# Patient Record
Sex: Male | Born: 1975 | Smoking: Former smoker
Health system: Southern US, Community
[De-identification: ages and names within clinical notes are randomized; demographics above are authoritative.]

## PROBLEM LIST (undated history)

## (undated) DIAGNOSIS — I10 Essential (primary) hypertension: Secondary | ICD-10-CM

## (undated) DIAGNOSIS — K56609 Unspecified intestinal obstruction, unspecified as to partial versus complete obstruction: Secondary | ICD-10-CM

## (undated) DIAGNOSIS — G4700 Insomnia, unspecified: Secondary | ICD-10-CM

## (undated) HISTORY — PX: LAPAROTOMY: SHX154

## (undated) HISTORY — DX: Unspecified intestinal obstruction, unspecified as to partial versus complete obstruction: K56.609

---

## 2019-07-04 ENCOUNTER — Other Ambulatory Visit: Payer: Self-pay

## 2019-07-04 ENCOUNTER — Emergency Department (HOSPITAL_COMMUNITY): Payer: Self-pay

## 2019-07-04 ENCOUNTER — Encounter (HOSPITAL_COMMUNITY): Payer: Self-pay | Admitting: Emergency Medicine

## 2019-07-04 ENCOUNTER — Observation Stay (HOSPITAL_COMMUNITY)
Admission: EM | Admit: 2019-07-04 | Discharge: 2019-07-05 | Disposition: A | Discharge status: Home / Self Care | Payer: Self-pay | Attending: Internal Medicine | Admitting: Internal Medicine

## 2019-07-04 ENCOUNTER — Observation Stay (HOSPITAL_COMMUNITY): Payer: Self-pay

## 2019-07-04 DIAGNOSIS — Z0189 Encounter for other specified special examinations: Secondary | ICD-10-CM

## 2019-07-04 DIAGNOSIS — K56609 Unspecified intestinal obstruction, unspecified as to partial versus complete obstruction: Principal | ICD-10-CM | POA: Insufficient documentation

## 2019-07-04 DIAGNOSIS — Z20828 Contact with and (suspected) exposure to other viral communicable diseases: Secondary | ICD-10-CM | POA: Insufficient documentation

## 2019-07-04 DIAGNOSIS — Z6834 Body mass index (BMI) 34.0-34.9, adult: Secondary | ICD-10-CM | POA: Insufficient documentation

## 2019-07-04 DIAGNOSIS — F17200 Nicotine dependence, unspecified, uncomplicated: Secondary | ICD-10-CM | POA: Insufficient documentation

## 2019-07-04 DIAGNOSIS — I10 Essential (primary) hypertension: Secondary | ICD-10-CM | POA: Insufficient documentation

## 2019-07-04 DIAGNOSIS — E669 Obesity, unspecified: Secondary | ICD-10-CM | POA: Insufficient documentation

## 2019-07-04 HISTORY — DX: Essential (primary) hypertension: I10

## 2019-07-04 HISTORY — DX: Insomnia, unspecified: G47.00

## 2019-07-04 LAB — RAPID URINE DRUG SCREEN, HOSP PERFORMED
Amphetamines: NOT DETECTED
Barbiturates: NOT DETECTED
Benzodiazepines: NOT DETECTED
Cocaine: NOT DETECTED
Opiates: NOT DETECTED
Tetrahydrocannabinol: POSITIVE — AB

## 2019-07-04 LAB — COMPREHENSIVE METABOLIC PANEL
ALT: 28 U/L (ref 0–44)
AST: 20 U/L (ref 15–41)
Albumin: 4.3 g/dL (ref 3.5–5.0)
Alkaline Phosphatase: 87 U/L (ref 38–126)
Anion gap: 12 (ref 5–15)
BUN: 15 mg/dL (ref 6–20)
CO2: 20 mmol/L — ABNORMAL LOW (ref 22–32)
Calcium: 9.6 mg/dL (ref 8.9–10.3)
Chloride: 105 mmol/L (ref 98–111)
Creatinine, Ser: 0.9 mg/dL (ref 0.61–1.24)
GFR calc Af Amer: >60 mL/min (ref >60)
GFR calc non Af Amer: >60 mL/min (ref >60)
Glucose, Bld: 118 mg/dL — ABNORMAL HIGH (ref 70–99)
Potassium: 4.1 mmol/L (ref 3.5–5.1)
Sodium: 137 mmol/L (ref 135–145)
Total Bilirubin: 0.8 mg/dL (ref 0.3–1.2)
Total Protein: 7.7 g/dL (ref 6.5–8.1)

## 2019-07-04 LAB — CBC
HCT: 49.2 % (ref 39.0–52.0)
Hemoglobin: 16.7 g/dL (ref 13.0–17.0)
MCH: 30.4 pg (ref 26.0–34.0)
MCHC: 33.9 g/dL (ref 30.0–36.0)
MCV: 89.6 fL (ref 80.0–100.0)
Platelets: 173 10*3/uL (ref 150–400)
RBC: 5.49 MIL/uL (ref 4.22–5.81)
RDW: 13.1 % (ref 11.5–15.5)
WBC: 10.8 10*3/uL — ABNORMAL HIGH (ref 4.0–10.5)
nRBC: 0 % (ref 0.0–0.2)

## 2019-07-04 LAB — URINALYSIS, ROUTINE W REFLEX MICROSCOPIC
Bacteria, UA: NONE SEEN
Bilirubin Urine: NEGATIVE
Glucose, UA: NEGATIVE mg/dL
Ketones, ur: NEGATIVE mg/dL
Leukocytes,Ua: NEGATIVE
Nitrite: NEGATIVE
Protein, ur: NEGATIVE mg/dL
Specific Gravity, Urine: 1.014 (ref 1.005–1.030)
pH: 5 (ref 5.0–8.0)

## 2019-07-04 LAB — SARS CORONAVIRUS 2 (TAT 6-24 HRS): SARS Coronavirus 2: NEGATIVE

## 2019-07-04 LAB — LIPASE, BLOOD: Lipase: 21 U/L (ref 11–51)

## 2019-07-04 MED ORDER — ONDANSETRON HCL 4 MG/2ML IJ SOLN
4.0000 mg | Freq: Once | INTRAMUSCULAR | Status: AC
  Administered 2019-07-04: 4 mg via INTRAVENOUS
  Filled 2019-07-04: qty 2

## 2019-07-04 MED ORDER — ACETAMINOPHEN 325 MG PO TABS: 650.0000 mg | ORAL_TABLET | Freq: Four times a day (QID) | ORAL | Status: DC | PRN

## 2019-07-04 MED ORDER — LORAZEPAM 2 MG/ML IJ SOLN
1.0000 mg | Freq: Once | INTRAMUSCULAR | Status: AC
  Administered 2019-07-05: 1 mg via INTRAVENOUS
  Filled 2019-07-04: qty 1

## 2019-07-04 MED ORDER — BENZOCAINE 20 % MT AERO
INHALATION_SPRAY | Freq: Once | OROMUCOSAL | Status: AC
  Administered 2019-07-04: 2 via OROMUCOSAL

## 2019-07-04 MED ORDER — PROMETHAZINE HCL 25 MG/ML IJ SOLN
12.5000 mg | Freq: Four times a day (QID) | INTRAMUSCULAR | Status: DC | PRN
  Administered 2019-07-04 – 2019-07-05 (×2): 12.5 mg via INTRAVENOUS
  Filled 2019-07-04 (×2): qty 1

## 2019-07-04 MED ORDER — LORAZEPAM 2 MG/ML IJ SOLN
1.0000 mg | Freq: Once | INTRAMUSCULAR | Status: AC
  Administered 2019-07-04: 1 mg via INTRAVENOUS
  Filled 2019-07-04: qty 1

## 2019-07-04 MED ORDER — MORPHINE SULFATE (PF) 4 MG/ML IV SOLN
4.0000 mg | Freq: Once | INTRAVENOUS | Status: AC
  Administered 2019-07-04: 4 mg via INTRAVENOUS
  Filled 2019-07-04: qty 1

## 2019-07-04 MED ORDER — HYDROMORPHONE HCL 1 MG/ML IJ SOLN
0.5000 mg | INTRAMUSCULAR | Status: DC | PRN
  Administered 2019-07-05 (×2): 1 mg via INTRAVENOUS
  Filled 2019-07-04 (×2): qty 1

## 2019-07-04 MED ORDER — SODIUM CHLORIDE 0.9% FLUSH
3.0000 mL | Freq: Once | INTRAVENOUS | Status: AC
  Administered 2019-07-04: 3 mL via INTRAVENOUS

## 2019-07-04 MED ORDER — IOHEXOL 300 MG/ML  SOLN
100.0000 mL | Freq: Once | INTRAMUSCULAR | Status: AC | PRN
  Administered 2019-07-04: 04:00:00 100 mL via INTRAVENOUS

## 2019-07-04 MED ORDER — ONDANSETRON HCL 4 MG/2ML IJ SOLN
4.0000 mg | Freq: Four times a day (QID) | INTRAMUSCULAR | Status: DC | PRN
  Administered 2019-07-04 (×2): 4 mg via INTRAVENOUS
  Filled 2019-07-04 (×2): qty 2

## 2019-07-04 MED ORDER — LORAZEPAM 2 MG/ML IJ SOLN
0.5000 mg | Freq: Once | INTRAMUSCULAR | Status: AC
  Administered 2019-07-04: 0.5 mg via INTRAVENOUS
  Filled 2019-07-04: qty 1

## 2019-07-04 MED ORDER — HYDROMORPHONE HCL 1 MG/ML IJ SOLN
0.5000 mg | INTRAMUSCULAR | Status: DC | PRN
  Administered 2019-07-04 (×4): 0.5 mg via INTRAVENOUS
  Filled 2019-07-04 (×2): qty 0.5
  Filled 2019-07-04 (×2): qty 1

## 2019-07-04 MED ORDER — ONDANSETRON 4 MG PO TBDP: 4.0000 mg | ORAL_TABLET | Freq: Once | ORAL | Status: DC | PRN

## 2019-07-04 MED ORDER — ACETAMINOPHEN 650 MG RE SUPP: 650.0000 mg | Freq: Four times a day (QID) | RECTAL | Status: DC | PRN

## 2019-07-04 MED ORDER — ENOXAPARIN SODIUM 40 MG/0.4ML ~~LOC~~ SOLN
40.0000 mg | SUBCUTANEOUS | Status: DC
  Administered 2019-07-04: 21:00:00 40 mg via SUBCUTANEOUS
  Filled 2019-07-04: qty 0.4

## 2019-07-04 MED ORDER — INFLUENZA VAC SPLIT QUAD 0.5 ML IM SUSY: 0.5000 mL | PREFILLED_SYRINGE | INTRAMUSCULAR | Status: DC

## 2019-07-04 MED ORDER — DIATRIZOATE MEGLUMINE & SODIUM 66-10 % PO SOLN
90.0000 mL | Freq: Once | ORAL | Status: AC
  Administered 2019-07-04: 90 mL via NASOGASTRIC
  Filled 2019-07-04: qty 90

## 2019-07-04 MED ORDER — SODIUM CHLORIDE 0.9 % IV SOLN: INTRAVENOUS | Status: DC
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
## 2019-07-04 NOTE — ED Triage Notes (Signed)
Pt arriving via EMS for right side abdominal pain x3 days. Nausea but no vomiting. No fever. Covid negative approx 1 month ago.

## 2019-07-05 ENCOUNTER — Observation Stay (HOSPITAL_COMMUNITY): Payer: Self-pay

## 2019-07-05 LAB — COMPREHENSIVE METABOLIC PANEL
ALT: 34 U/L (ref 0–44)
AST: 23 U/L (ref 15–41)
Albumin: 4.9 g/dL (ref 3.5–5.0)
Alkaline Phosphatase: 94 U/L (ref 38–126)
Anion gap: 15 (ref 5–15)
BUN: 18 mg/dL (ref 6–20)
CO2: 34 mmol/L — ABNORMAL HIGH (ref 22–32)
Calcium: 10.4 mg/dL — ABNORMAL HIGH (ref 8.9–10.3)
Chloride: 96 mmol/L — ABNORMAL LOW (ref 98–111)
Creatinine, Ser: 1.24 mg/dL (ref 0.61–1.24)
GFR calc Af Amer: >60 mL/min (ref >60)
GFR calc non Af Amer: >60 mL/min (ref >60)
Glucose, Bld: 114 mg/dL — ABNORMAL HIGH (ref 70–99)
Potassium: 3.2 mmol/L — ABNORMAL LOW (ref 3.5–5.1)
Sodium: 145 mmol/L (ref 135–145)
Total Bilirubin: 1.2 mg/dL (ref 0.3–1.2)
Total Protein: 8.7 g/dL — ABNORMAL HIGH (ref 6.5–8.1)

## 2019-07-05 LAB — HIV ANTIBODY (ROUTINE TESTING W REFLEX): HIV Screen 4th Generation wRfx: NONREACTIVE

## 2019-07-05 LAB — MAGNESIUM: Magnesium: 2.5 mg/dL — ABNORMAL HIGH (ref 1.7–2.4)

## 2019-07-05 LAB — PHOSPHORUS: Phosphorus: 3.8 mg/dL (ref 2.5–4.6)

## 2019-07-05 MED ORDER — POTASSIUM CHLORIDE CRYS ER 20 MEQ PO TBCR
40.0000 meq | EXTENDED_RELEASE_TABLET | Freq: Once | ORAL | Status: AC
  Administered 2019-07-05: 13:00:00 40 meq via ORAL
  Filled 2019-07-05: qty 2
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
## 2019-07-05 NOTE — Discharge Instructions (Signed)
Follow with PCP as needed  Please get a complete blood count and chemistry panel checked by your Primary MD at your next visit, and again as instructed by your Primary MD. Please get your medications reviewed and adjusted by your Primary MD.  Please request your Primary MD to go over all Hospital Tests and Procedure/Radiological results at the follow up, please get all Hospital records sent to your Prim MD by signing hospital release before you go home.  In some cases, there will be blood work, cultures and biopsy results pending at the time of your discharge. Please request that your primary care M.D. goes through all the records of your hospital data and follows up on these results.  If you had Pneumonia of Lung problems at the Hospital: Please get a 2 view Chest X ray done in 6-8 weeks after hospital discharge or sooner if instructed by your Primary MD.  If you have Congestive Heart Failure: Please call your Cardiologist or Primary MD anytime you have any of the following symptoms:  1) 3 pound weight gain in 24 hours or 5 pounds in 1 week  2) shortness of breath, with or without a dry hacking cough  3) swelling in the hands, feet or stomach  4) if you have to sleep on extra pillows at night in order to breathe  Follow cardiac low salt diet and 1.5 lit/day fluid restriction.  If you have diabetes Accuchecks 4 times/day, Once in AM empty stomach and then before each meal. Log in all results and show them to your primary doctor at your next visit. If any glucose reading is under 80 or above 300 call your primary MD immediately.  If you have Seizure/Convulsions/Epilepsy: Please do not drive, operate heavy machinery, participate in activities at heights or participate in high speed sports until you have seen by Primary MD or a Neurologist and advised to do so again. Per [facility] statutes, patients with seizures are not allowed to drive until they have been seizure-free for six  months.  Use caution when using heavy equipment or power tools. Avoid working on ladders or at heights. Take showers instead of baths. Ensure the water temperature is not too high on the home water heater. Do not go swimming alone. Do not lock yourself in a room alone (i.e. bathroom). When caring for infants or small children, sit down when holding, feeding, or changing them to minimize risk of injury to the child in the event you have a seizure. Maintain good sleep hygiene. Avoid alcohol.   If you had Gastrointestinal Bleeding: Please ask your Primary MD to check a complete blood count within one week of discharge or at your next visit. Your endoscopic/colonoscopic biopsies that are pending at the time of discharge, will also need to followed by your Primary MD.  Get Medicines reviewed and adjusted. Please take all your medications with you for your next visit with your Primary MD  Please request your Primary MD to go over all hospital tests and procedure/radiological results at the follow up, please ask your Primary MD to get all Hospital records sent to his/her office.  If you experience worsening of your admission symptoms, develop shortness of breath, life threatening emergency, suicidal or homicidal thoughts you must seek medical attention immediately by calling 911 or calling your MD immediately  if symptoms less severe.  You must read complete instructions/literature along with all the possible adverse reactions/side effects for all the Medicines you take and that have  been prescribed to you. Take any new Medicines after you have completely understood and accpet all the possible adverse reactions/side effects.   Do not drive or operate heavy machinery when taking Pain medications.   Do not take more than prescribed Pain, Sleep and Anxiety Medications  Special Instructions: If you have smoked or chewed Tobacco  in the last 2 yrs please stop smoking, stop any regular Alcohol  and or any  Recreational drug use.  Wear Seat belts while driving.  Please note You were cared for by a hospitalist during your hospital stay. If you have any questions about your discharge medications or the care you received while you were in the hospital after you are discharged, you can call the unit and asked to speak with the hospitalist on call if the hospitalist that took care of you is not available. Once you are discharged, your primary care physician will handle any further medical issues. Please note that NO REFILLS for any discharge medications will be authorized once you are discharged, as it is imperative that you return to your primary care physician (or establish a relationship with a primary care physician if you do not have one) for your aftercare needs so that they can reassess your need for medications and monitor your lab values.  You can reach the hospitalist office at phone [phone] or fax [phone]   If you do not have a primary care physician, you can call [phone] for a physician referral.  Activity: As tolerated with Full fall precautions use walker/cane & assistance as needed    Diet: regular  Disposition Home

## 2019-07-05 NOTE — Progress Notes (Signed)
Subjective/Chief Complaint: No complaints Denies abdominal pain Passing flatus   Objective: Vital signs in last 24 hours: Temp:  [97.5 F (36.4 C)-97.6 F (36.4 C)] 97.6 F (36.4 C) (12/26 2135) Pulse Rate:  [63-98] 82 (12/27 0538) Resp:  [16-28] 19 (12/27 0538) BP: (109-135)/(59-100) 122/85 (12/27 0538) SpO2:  [95 %-99 %] 95 % (12/27 0538) Weight:  [105.2 kg] 105.2 kg (12/27 0500)    Intake/Output from previous day: 12/26 0701 - 12/27 0700 In: 2809.3 [P.O.:1020; I.V.:1789.3] Out: 17300 [Emesis/NG output:16300; Blood:1000] Intake/Output this shift: Total I/O In: -  Out: 1100 [Emesis/NG output:1100]  Exam: Awake and alert Abdomen soft, NT/ND   Lab Results:  Recent Labs    07/04/19 0012  WBC 10.8*  HGB 16.7  HCT 49.2  PLT 173   BMET Recent Labs    07/04/19 0012 07/05/19 0606  NA 137 145  K 4.1 3.2*  CL 105 96*  CO2 20* 34*  GLUCOSE 118* 114*  BUN 15 18  CREATININE 0.90 1.24  CALCIUM 9.6 10.4*   PT/INR No results for input(s): LABPROT, INR in the last 72 hours. ABG No results for input(s): PHART, HCO3 in the last 72 hours.  Invalid input(s): PCO2, PO2  Studies/Results: DG Abdomen 1 View  Result Date: 07/04/2019 CLINICAL DATA:  NG tube placement. EXAM: ABDOMEN - 1 VIEW COMPARISON:  None. FINDINGS: Nasogastric tube is looped once over the stomach with tip in the right upper quadrant likely over the distal stomach/proximal duodenum. Bowel gas pattern is nonobstructive. No free peritoneal air. Mild degenerate change of the spine. IMPRESSION: 1.  Nonobstructive bowel gas pattern. 2. Nasogastric tube looped once over the stomach with tip in the right upper quadrant likely over the distal stomach or proximal duodenum. Electronically Signed   By: Marin Olp M.D.   On: 07/04/2019 05:44   CT ABDOMEN PELVIS W CONTRAST  Result Date: 07/04/2019 CLINICAL DATA:  Abdominal pain right-sided for 3 days. Nausea but no vomiting. EXAM: CT ABDOMEN AND PELVIS  WITH CONTRAST TECHNIQUE: Multidetector CT imaging of the abdomen and pelvis was performed using the standard protocol following bolus administration of intravenous contrast. CONTRAST:  169mL OMNIPAQUE IOHEXOL 300 MG/ML  SOLN COMPARISON:  None. FINDINGS: Lower chest: Minimal dependent bibasilar atelectasis. Hepatobiliary: Minimal diffuse low-attenuation of the liver without focal mass. Gallbladder and biliary tree are normal. Pancreas: Normal. Spleen: Normal. Adrenals/Urinary Tract: Adrenal glands are normal. Kidneys are normal size without hydronephrosis or nephrolithiasis. Ureters and bladder are normal. Stomach/Bowel: Stomach is normal. There are a few fluid-filled minimally dilated proximal jejunal loops with transition to normal caliber small bowel in the right mid abdomen. Remainder of the small bowel is normal. Appendix is normal. Colon is normal. Vascular/Lymphatic: Normal. Reproductive: Normal. Other: No free fluid or focal inflammatory change. Musculoskeletal: Normal. IMPRESSION: 1.  No acute process in the right lower quadrant. 2. A few fluid-filled minimally dilated proximal jejunal loops with transition to normal caliber small bowel in the jejunum over the left mid abdomen. Findings may be due to focal ileus, although early/partial small bowel obstruction is possible. Electronically Signed   By: Marin Olp M.D.   On: 07/04/2019 04:33   DG Abd Portable 1V-Small Bowel Obstruction Protocol-initial, 8 hr delay  Result Date: 07/05/2019 CLINICAL DATA:  Small-bowel obstruction EXAM: PORTABLE ABDOMEN - 1 VIEW COMPARISON:  07/04/2019 FINDINGS: There is contrast material within the colon, extending into the sigmoid colon. No dilated small bowel identified. Enteric tube tip and side port project near the gastroduodenal junction.  IMPRESSION: Contrast material extending to the sigmoid colon Electronically Signed   By: Deatra Robinson M.D.   On: 07/05/2019 02:26   DG Abd Portable 1V-Small Bowel  Protocol-Position Verification  Result Date: 07/04/2019 CLINICAL DATA:  NG tube placement. Right-sided abdominal pain. Nausea. EXAM: PORTABLE ABDOMEN - 1 VIEW COMPARISON:  None. FINDINGS: NG tube tip is in the distal stomach. The bowel gas pattern is normal. Contrast in the bladder from the recent CT scan. Bones are normal. IMPRESSION: Benign-appearing abdomen. NG tube tip in good position in the distal stomach. Electronically Signed   By: Francene Boyers M.D.   On: 07/04/2019 09:56    Anti-infectives: Anti-infectives (From admission, onward)   None      Assessment/Plan: pSBO  Small bowel protocol xray shows contrast has made it all the way to the sigmoid colon.   Will d/c NG and start liquids From a surgical standpoint, he may be discharged after lunch if tolerating po.  He only needs follow up with [facility] PRN.   LOS: 0 days    Abigail Miyamoto 07/05/2019

## 2019-07-05 NOTE — Progress Notes (Signed)
Nsg Discharge Note  Admit Date:  07/04/2019 Discharge date: 07/05/2019   Kemo Spruce to be D/C'd Home per MD order.  AVS completed.  Copy for chart, and copy for patient signed, and dated. Patient/caregiver able to verbalize understanding.  Discharge Medication: Allergies as of 07/05/2019   No Known Allergies     Medication List    TAKE these medications   zolpidem 10 MG tablet Commonly known as: AMBIEN Take 10 mg by mouth at bedtime as needed for sleep.       Discharge Assessment: Vitals:   07/04/19 2135 07/05/19 0538  BP: (!) 109/59 122/85  Pulse: 63 82  Resp: 18 19  Temp: 97.6 F (36.4 C)   SpO2: 99% 95%   Skin clean, dry and intact without evidence of skin break down, no evidence of skin tears noted. IV catheter discontinued intact. Site without signs and symptoms of complications - no redness or edema noted at insertion site, patient denies c/o pain - only slight tenderness at site.  Dressing with slight pressure applied.  D/c Instructions-Education: Discharge instructions given to patient/family with verbalized understanding. D/c education completed with patient/family including follow up instructions, medication list, d/c activities limitations if indicated, with other d/c instructions as indicated by MD - patient able to verbalize understanding, all questions fully answered. Patient instructed to return to ED, call 911, or call MD for any changes in condition.  Patient ambulated independently and was D/C'd home via private auto.  Eustace Pen, RN 07/05/2019 1:48 PM

## 2019-07-06 LAB — CBC WITH DIFFERENTIAL/PLATELET
Abs Immature Granulocytes: 0.03 10*3/uL (ref 0.00–0.07)
Basophils Absolute: 0.1 10*3/uL (ref 0.0–0.1)
Basophils Relative: 1 %
Eosinophils Absolute: 0 10*3/uL (ref 0.0–0.5)
Eosinophils Relative: 0 %
HCT: 55.6 % — ABNORMAL HIGH (ref 39.0–52.0)
Hemoglobin: 18.7 g/dL — ABNORMAL HIGH (ref 13.0–17.0)
Immature Granulocytes: 0 %
Lymphocytes Relative: 12 %
Lymphs Abs: 1.3 10*3/uL (ref 0.7–4.0)
MCH: 30.5 pg (ref 26.0–34.0)
MCHC: 33.6 g/dL (ref 30.0–36.0)
MCV: 90.6 fL (ref 80.0–100.0)
Monocytes Absolute: 1.2 10*3/uL — ABNORMAL HIGH (ref 0.1–1.0)
Monocytes Relative: 11 %
Neutro Abs: 8.4 10*3/uL — ABNORMAL HIGH (ref 1.7–7.7)
Neutrophils Relative %: 76 %
Platelets: 188 10*3/uL (ref 150–400)
RBC: 6.14 MIL/uL — ABNORMAL HIGH (ref 4.22–5.81)
RDW: 13.2 % (ref 11.5–15.5)
WBC: 11 10*3/uL — ABNORMAL HIGH (ref 4.0–10.5)
nRBC: 0 % (ref 0.0–0.2)

## 2019-07-07 ENCOUNTER — Emergency Department (HOSPITAL_COMMUNITY): Payer: Self-pay

## 2019-07-07 ENCOUNTER — Other Ambulatory Visit: Payer: Self-pay

## 2019-07-07 ENCOUNTER — Inpatient Hospital Stay (HOSPITAL_COMMUNITY)
Admission: EM | Admit: 2019-07-07 | Discharge: 2019-07-11 | DRG: 336 | Disposition: A | Discharge status: Home / Self Care | Payer: Self-pay | Attending: Surgery | Admitting: Surgery

## 2019-07-07 ENCOUNTER — Encounter (HOSPITAL_COMMUNITY): Payer: Self-pay

## 2019-07-07 DIAGNOSIS — Q438 Other specified congenital malformations of intestine: Secondary | ICD-10-CM

## 2019-07-07 DIAGNOSIS — F17200 Nicotine dependence, unspecified, uncomplicated: Secondary | ICD-10-CM | POA: Diagnosis present

## 2019-07-07 DIAGNOSIS — K56600 Partial intestinal obstruction, unspecified as to cause: Secondary | ICD-10-CM | POA: Diagnosis present

## 2019-07-07 DIAGNOSIS — E860 Dehydration: Secondary | ICD-10-CM | POA: Diagnosis present

## 2019-07-07 DIAGNOSIS — Z20822 Contact with and (suspected) exposure to covid-19: Secondary | ICD-10-CM | POA: Diagnosis present

## 2019-07-07 DIAGNOSIS — I10 Essential (primary) hypertension: Secondary | ICD-10-CM | POA: Diagnosis present

## 2019-07-07 DIAGNOSIS — K56609 Unspecified intestinal obstruction, unspecified as to partial versus complete obstruction: Secondary | ICD-10-CM

## 2019-07-07 DIAGNOSIS — Z79899 Other long term (current) drug therapy: Secondary | ICD-10-CM

## 2019-07-07 DIAGNOSIS — G4700 Insomnia, unspecified: Secondary | ICD-10-CM | POA: Diagnosis present

## 2019-07-07 DIAGNOSIS — K5651 Intestinal adhesions [bands], with partial obstruction: Principal | ICD-10-CM | POA: Diagnosis present

## 2019-07-07 LAB — CBC WITH DIFFERENTIAL/PLATELET
Abs Immature Granulocytes: 0.04 10*3/uL (ref 0.00–0.07)
Basophils Absolute: 0.1 10*3/uL (ref 0.0–0.1)
Basophils Relative: 1 %
Eosinophils Absolute: 0.3 10*3/uL (ref 0.0–0.5)
Eosinophils Relative: 3 %
HCT: 51.2 % (ref 39.0–52.0)
Hemoglobin: 17.7 g/dL — ABNORMAL HIGH (ref 13.0–17.0)
Immature Granulocytes: 0 %
Lymphocytes Relative: 16 %
Lymphs Abs: 1.6 10*3/uL (ref 0.7–4.0)
MCH: 30.3 pg (ref 26.0–34.0)
MCHC: 34.6 g/dL (ref 30.0–36.0)
MCV: 87.5 fL (ref 80.0–100.0)
Monocytes Absolute: 1 10*3/uL (ref 0.1–1.0)
Monocytes Relative: 9 %
Neutro Abs: 7.4 10*3/uL (ref 1.7–7.7)
Neutrophils Relative %: 71 %
Platelets: 207 10*3/uL (ref 150–400)
RBC: 5.85 MIL/uL — ABNORMAL HIGH (ref 4.22–5.81)
RDW: 12.7 % (ref 11.5–15.5)
WBC: 10.3 10*3/uL (ref 4.0–10.5)
nRBC: 0 % (ref 0.0–0.2)

## 2019-07-07 LAB — COMPREHENSIVE METABOLIC PANEL
ALT: 31 U/L (ref 0–44)
AST: 30 U/L (ref 15–41)
Albumin: 4.4 g/dL (ref 3.5–5.0)
Alkaline Phosphatase: 79 U/L (ref 38–126)
Anion gap: 12 (ref 5–15)
BUN: 22 mg/dL — ABNORMAL HIGH (ref 6–20)
CO2: 24 mmol/L (ref 22–32)
Calcium: 9.8 mg/dL (ref 8.9–10.3)
Chloride: 100 mmol/L (ref 98–111)
Creatinine, Ser: 1 mg/dL (ref 0.61–1.24)
GFR calc Af Amer: >60 mL/min (ref >60)
GFR calc non Af Amer: >60 mL/min (ref >60)
Glucose, Bld: 125 mg/dL — ABNORMAL HIGH (ref 70–99)
Potassium: 3.7 mmol/L (ref 3.5–5.1)
Sodium: 136 mmol/L (ref 135–145)
Total Bilirubin: 1.4 mg/dL — ABNORMAL HIGH (ref 0.3–1.2)
Total Protein: 7.8 g/dL (ref 6.5–8.1)

## 2019-07-07 LAB — URINALYSIS, ROUTINE W REFLEX MICROSCOPIC
Bilirubin Urine: NEGATIVE
Glucose, UA: NEGATIVE mg/dL
Hgb urine dipstick: NEGATIVE
Ketones, ur: NEGATIVE mg/dL
Leukocytes,Ua: NEGATIVE
Nitrite: NEGATIVE
Protein, ur: NEGATIVE mg/dL
Specific Gravity, Urine: 1.035 — ABNORMAL HIGH (ref 1.005–1.030)
pH: 7 (ref 5.0–8.0)

## 2019-07-07 LAB — LIPASE, BLOOD: Lipase: 20 U/L (ref 11–51)

## 2019-07-07 MED ORDER — PANTOPRAZOLE SODIUM 40 MG IV SOLR
40.0000 mg | Freq: Every day | INTRAVENOUS | Status: DC
  Administered 2019-07-07 – 2019-07-10 (×4): 40 mg via INTRAVENOUS
  Filled 2019-07-07 (×4): qty 40

## 2019-07-07 MED ORDER — ONDANSETRON 4 MG PO TBDP: 4.0000 mg | ORAL_TABLET | Freq: Four times a day (QID) | ORAL | Status: DC | PRN

## 2019-07-07 MED ORDER — METOPROLOL TARTRATE 5 MG/5ML IV SOLN: 5.0000 mg | Freq: Four times a day (QID) | INTRAVENOUS | Status: DC | PRN

## 2019-07-07 MED ORDER — LORAZEPAM 2 MG/ML IJ SOLN
1.0000 mg | Freq: Once | INTRAMUSCULAR | Status: AC
  Administered 2019-07-08: 1 mg via INTRAVENOUS
  Filled 2019-07-07: qty 1

## 2019-07-07 MED ORDER — KCL IN DEXTROSE-NACL 20-5-0.9 MEQ/L-%-% IV SOLN
INTRAVENOUS | Status: DC
  Filled 2019-07-07 (×7): qty 1000

## 2019-07-07 MED ORDER — KETOROLAC TROMETHAMINE 15 MG/ML IJ SOLN
15.0000 mg | Freq: Four times a day (QID) | INTRAMUSCULAR | Status: DC | PRN
  Administered 2019-07-07 – 2019-07-08 (×2): 15 mg via INTRAVENOUS
  Filled 2019-07-07 (×2): qty 1

## 2019-07-07 MED ORDER — SODIUM CHLORIDE 0.9 % IV SOLN: INTRAVENOUS | Status: DC

## 2019-07-07 MED ORDER — HYDROMORPHONE HCL 1 MG/ML IJ SOLN: 1.0000 mg | INTRAMUSCULAR | Status: DC | PRN

## 2019-07-07 MED ORDER — ACETAMINOPHEN 325 MG PO TABS: 650.0000 mg | ORAL_TABLET | Freq: Four times a day (QID) | ORAL | Status: DC | PRN

## 2019-07-07 MED ORDER — HYDROMORPHONE HCL 1 MG/ML IJ SOLN
1.0000 mg | INTRAMUSCULAR | Status: AC | PRN
  Administered 2019-07-07 (×2): 1 mg via INTRAVENOUS
  Filled 2019-07-07 (×2): qty 1

## 2019-07-07 MED ORDER — HYDROMORPHONE HCL 1 MG/ML IJ SOLN
0.5000 mg | INTRAMUSCULAR | Status: DC | PRN
  Administered 2019-07-07 – 2019-07-11 (×26): 1 mg via INTRAVENOUS
  Filled 2019-07-07 (×26): qty 1

## 2019-07-07 MED ORDER — DIPHENHYDRAMINE HCL 25 MG PO CAPS: 25.0000 mg | ORAL_CAPSULE | Freq: Four times a day (QID) | ORAL | Status: DC | PRN

## 2019-07-07 MED ORDER — SODIUM CHLORIDE (PF) 0.9 % IJ SOLN
INTRAMUSCULAR | Status: AC
  Filled 2019-07-07: qty 50

## 2019-07-07 MED ORDER — HYDROMORPHONE HCL 1 MG/ML IJ SOLN
1.0000 mg | Freq: Once | INTRAMUSCULAR | Status: AC
  Administered 2019-07-07: 1 mg via INTRAVENOUS
  Filled 2019-07-07: qty 1

## 2019-07-07 MED ORDER — METHOCARBAMOL 1000 MG/10ML IJ SOLN
500.0000 mg | Freq: Four times a day (QID) | INTRAVENOUS | Status: DC | PRN
  Administered 2019-07-09: 500 mg via INTRAVENOUS
  Filled 2019-07-07: qty 5
  Filled 2019-07-07: qty 500
  Filled 2019-07-07: qty 5

## 2019-07-07 MED ORDER — DIPHENHYDRAMINE HCL 50 MG/ML IJ SOLN
25.0000 mg | Freq: Four times a day (QID) | INTRAMUSCULAR | Status: DC | PRN
  Administered 2019-07-08: 25 mg via INTRAVENOUS
  Filled 2019-07-07: qty 1

## 2019-07-07 MED ORDER — IOHEXOL 300 MG/ML  SOLN
100.0000 mL | Freq: Once | INTRAMUSCULAR | Status: AC | PRN
  Administered 2019-07-07: 100 mL via INTRAVENOUS

## 2019-07-07 MED ORDER — ENOXAPARIN SODIUM 40 MG/0.4ML ~~LOC~~ SOLN
40.0000 mg | SUBCUTANEOUS | Status: DC
  Administered 2019-07-07 – 2019-07-10 (×3): 40 mg via SUBCUTANEOUS
  Filled 2019-07-07 (×3): qty 0.4

## 2019-07-07 MED ORDER — ONDANSETRON HCL 4 MG/2ML IJ SOLN
4.0000 mg | Freq: Once | INTRAMUSCULAR | Status: AC
  Administered 2019-07-07: 4 mg via INTRAVENOUS
  Filled 2019-07-07: qty 2

## 2019-07-07 MED ORDER — ACETAMINOPHEN 650 MG RE SUPP: 650.0000 mg | Freq: Four times a day (QID) | RECTAL | Status: DC | PRN

## 2019-07-07 MED ORDER — SODIUM CHLORIDE 0.9 % IV BOLUS
1000.0000 mL | Freq: Once | INTRAVENOUS | Status: AC
  Administered 2019-07-07: 1000 mL via INTRAVENOUS

## 2019-07-07 MED ORDER — ONDANSETRON HCL 4 MG/2ML IJ SOLN
4.0000 mg | Freq: Four times a day (QID) | INTRAMUSCULAR | Status: DC | PRN
  Administered 2019-07-08: 4 mg via INTRAVENOUS
  Filled 2019-07-07: qty 2

## 2019-07-07 NOTE — ED Notes (Signed)
Informed patient that surgery is on their way and will discuss care plan with him when they arrive
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
## 2019-07-07 NOTE — ED Triage Notes (Addendum)
Pt returns today with complaints of abd pain. Pt was dc 12/27 for the same- pt had partial SBO
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
## 2019-07-07 NOTE — ED Notes (Signed)
Patient requesting Ambien. MD messaged for order for medication.

## 2019-07-07 NOTE — ED Notes (Addendum)
Gave pt ice chips 

## 2019-07-08 ENCOUNTER — Inpatient Hospital Stay (HOSPITAL_COMMUNITY): Payer: Self-pay | Admitting: Certified Registered Nurse Anesthetist

## 2019-07-08 ENCOUNTER — Encounter (HOSPITAL_COMMUNITY): Admission: EM | Disposition: A | Discharge status: Home / Self Care | Payer: Self-pay | Source: Home / Self Care

## 2019-07-08 ENCOUNTER — Observation Stay (HOSPITAL_COMMUNITY): Payer: Self-pay

## 2019-07-08 HISTORY — PX: LAPAROTOMY: SHX154

## 2019-07-08 LAB — CBC
HCT: 44.2 % (ref 39.0–52.0)
Hemoglobin: 14.5 g/dL (ref 13.0–17.0)
MCH: 30.3 pg (ref 26.0–34.0)
MCHC: 32.8 g/dL (ref 30.0–36.0)
MCV: 92.5 fL (ref 80.0–100.0)
Platelets: 174 10*3/uL (ref 150–400)
RBC: 4.78 MIL/uL (ref 4.22–5.81)
RDW: 12.8 % (ref 11.5–15.5)
WBC: 9 10*3/uL (ref 4.0–10.5)
nRBC: 0 % (ref 0.0–0.2)

## 2019-07-08 LAB — COMPREHENSIVE METABOLIC PANEL
ALT: 25 U/L (ref 0–44)
AST: 23 U/L (ref 15–41)
Albumin: 3.7 g/dL (ref 3.5–5.0)
Alkaline Phosphatase: 64 U/L (ref 38–126)
Anion gap: 6 (ref 5–15)
BUN: 22 mg/dL — ABNORMAL HIGH (ref 6–20)
CO2: 26 mmol/L (ref 22–32)
Calcium: 8.7 mg/dL — ABNORMAL LOW (ref 8.9–10.3)
Chloride: 107 mmol/L (ref 98–111)
Creatinine, Ser: 1.07 mg/dL (ref 0.61–1.24)
GFR calc Af Amer: >60 mL/min (ref >60)
GFR calc non Af Amer: >60 mL/min (ref >60)
Glucose, Bld: 98 mg/dL (ref 70–99)
Potassium: 3.8 mmol/L (ref 3.5–5.1)
Sodium: 139 mmol/L (ref 135–145)
Total Bilirubin: 0.8 mg/dL (ref 0.3–1.2)
Total Protein: 6.3 g/dL — ABNORMAL LOW (ref 6.5–8.1)

## 2019-07-08 LAB — SARS CORONAVIRUS 2 (TAT 6-24 HRS): SARS Coronavirus 2: NEGATIVE

## 2019-07-08 SURGERY — LAPAROTOMY, EXPLORATORY
Anesthesia: General | Site: Abdomen

## 2019-07-08 MED ORDER — ONDANSETRON HCL 4 MG/2ML IJ SOLN: 4.0000 mg | Freq: Four times a day (QID) | INTRAMUSCULAR | Status: DC | PRN

## 2019-07-08 MED ORDER — ACETAMINOPHEN 10 MG/ML IV SOLN
INTRAVENOUS | Status: AC
  Administered 2019-07-08: 1000 mg via INTRAVENOUS
  Filled 2019-07-08: qty 100

## 2019-07-08 MED ORDER — MIDAZOLAM HCL 5 MG/5ML IJ SOLN
INTRAMUSCULAR | Status: DC | PRN
  Administered 2019-07-08: 2 mg via INTRAVENOUS

## 2019-07-08 MED ORDER — HYDROMORPHONE HCL 1 MG/ML IJ SOLN
0.5000 mg | INTRAMUSCULAR | Status: AC | PRN
  Administered 2019-07-08 (×2): 0.5 mg via INTRAVENOUS

## 2019-07-08 MED ORDER — DIPHENHYDRAMINE HCL 50 MG/ML IJ SOLN: 12.5000 mg | Freq: Four times a day (QID) | INTRAMUSCULAR | Status: DC | PRN

## 2019-07-08 MED ORDER — CHLORHEXIDINE GLUCONATE CLOTH 2 % EX PADS
6.0000 | MEDICATED_PAD | Freq: Every day | CUTANEOUS | Status: DC
  Administered 2019-07-09 – 2019-07-10 (×2): 6 via TOPICAL

## 2019-07-08 MED ORDER — HYDROMORPHONE HCL 1 MG/ML IJ SOLN
INTRAMUSCULAR | Status: AC
  Filled 2019-07-08: qty 1

## 2019-07-08 MED ORDER — LIDOCAINE 2% (20 MG/ML) 5 ML SYRINGE
INTRAMUSCULAR | Status: DC | PRN
  Administered 2019-07-08: 100 mg via INTRAVENOUS

## 2019-07-08 MED ORDER — DEXAMETHASONE SODIUM PHOSPHATE 10 MG/ML IJ SOLN
INTRAMUSCULAR | Status: DC | PRN
  Administered 2019-07-08: 10 mg via INTRAVENOUS

## 2019-07-08 MED ORDER — FENTANYL CITRATE (PF) 250 MCG/5ML IJ SOLN
INTRAMUSCULAR | Status: AC
  Filled 2019-07-08: qty 5

## 2019-07-08 MED ORDER — ONDANSETRON HCL 4 MG/2ML IJ SOLN
INTRAMUSCULAR | Status: DC | PRN
  Administered 2019-07-08: 4 mg via INTRAVENOUS

## 2019-07-08 MED ORDER — LIDOCAINE 2% (20 MG/ML) 5 ML SYRINGE
INTRAMUSCULAR | Status: AC
  Filled 2019-07-08: qty 5

## 2019-07-08 MED ORDER — FENTANYL CITRATE (PF) 100 MCG/2ML IJ SOLN: 25.0000 ug | INTRAMUSCULAR | Status: DC | PRN

## 2019-07-08 MED ORDER — PROPOFOL 10 MG/ML IV BOLUS
INTRAVENOUS | Status: AC
  Filled 2019-07-08: qty 20

## 2019-07-08 MED ORDER — HYDROMORPHONE HCL 1 MG/ML IJ SOLN
INTRAMUSCULAR | Status: AC
  Administered 2019-07-08: 0.5 mg via INTRAVENOUS
  Filled 2019-07-08: qty 1

## 2019-07-08 MED ORDER — 0.9 % SODIUM CHLORIDE (POUR BTL) OPTIME
TOPICAL | Status: DC | PRN
  Administered 2019-07-08: 2000 mL

## 2019-07-08 MED ORDER — SUGAMMADEX SODIUM 500 MG/5ML IV SOLN
INTRAVENOUS | Status: AC
  Filled 2019-07-08: qty 5

## 2019-07-08 MED ORDER — KETOROLAC TROMETHAMINE 15 MG/ML IJ SOLN
INTRAMUSCULAR | Status: AC
  Administered 2019-07-08: 15 mg
  Filled 2019-07-08: qty 1

## 2019-07-08 MED ORDER — ROCURONIUM BROMIDE 10 MG/ML (PF) SYRINGE
PREFILLED_SYRINGE | INTRAVENOUS | Status: AC
  Filled 2019-07-08: qty 10

## 2019-07-08 MED ORDER — LABETALOL HCL 5 MG/ML IV SOLN
INTRAVENOUS | Status: AC
  Administered 2019-07-08: 5 mg via INTRAVENOUS
  Filled 2019-07-08: qty 4

## 2019-07-08 MED ORDER — MIDAZOLAM HCL 2 MG/2ML IJ SOLN
INTRAMUSCULAR | Status: AC
  Filled 2019-07-08: qty 2

## 2019-07-08 MED ORDER — LABETALOL HCL 5 MG/ML IV SOLN
INTRAVENOUS | Status: DC | PRN
  Administered 2019-07-08: 5 mg via INTRAVENOUS
  Administered 2019-07-08 (×2): 2.5 mg via INTRAVENOUS

## 2019-07-08 MED ORDER — PROPOFOL 500 MG/50ML IV EMUL
INTRAVENOUS | Status: AC
  Filled 2019-07-08: qty 50

## 2019-07-08 MED ORDER — SUCCINYLCHOLINE CHLORIDE 200 MG/10ML IV SOSY
PREFILLED_SYRINGE | INTRAVENOUS | Status: DC | PRN
  Administered 2019-07-08: 120 mg via INTRAVENOUS

## 2019-07-08 MED ORDER — LACTATED RINGERS IV SOLN: INTRAVENOUS | Status: DC

## 2019-07-08 MED ORDER — HYDRALAZINE HCL 20 MG/ML IJ SOLN
5.0000 mg | Freq: Once | INTRAMUSCULAR | Status: AC
  Administered 2019-07-08: 17:00:00 5 mg via INTRAVENOUS

## 2019-07-08 MED ORDER — PROPOFOL 500 MG/50ML IV EMUL
INTRAVENOUS | Status: DC | PRN
  Administered 2019-07-08: 25 ug/kg/min via INTRAVENOUS

## 2019-07-08 MED ORDER — PROPOFOL 10 MG/ML IV BOLUS
INTRAVENOUS | Status: DC | PRN
  Administered 2019-07-08: 200 mg via INTRAVENOUS

## 2019-07-08 MED ORDER — SODIUM CHLORIDE 0.9 % IV SOLN
INTRAVENOUS | Status: DC | PRN
  Administered 2019-07-08: 2 g via INTRAVENOUS

## 2019-07-08 MED ORDER — NALOXONE HCL 0.4 MG/ML IJ SOLN: 0.4000 mg | INTRAMUSCULAR | Status: DC | PRN

## 2019-07-08 MED ORDER — DIPHENHYDRAMINE HCL 12.5 MG/5ML PO ELIX
12.5000 mg | ORAL_SOLUTION | Freq: Four times a day (QID) | ORAL | Status: DC | PRN
  Filled 2019-07-08: qty 5

## 2019-07-08 MED ORDER — DEXAMETHASONE SODIUM PHOSPHATE 10 MG/ML IJ SOLN
INTRAMUSCULAR | Status: AC
  Filled 2019-07-08: qty 1

## 2019-07-08 MED ORDER — SODIUM CHLORIDE 0.9 % IV SOLN
INTRAVENOUS | Status: AC
  Filled 2019-07-08: qty 2

## 2019-07-08 MED ORDER — ROCURONIUM BROMIDE 50 MG/5ML IV SOSY
PREFILLED_SYRINGE | INTRAVENOUS | Status: DC | PRN
  Administered 2019-07-08: 50 mg via INTRAVENOUS
  Administered 2019-07-08: 20 mg via INTRAVENOUS
  Administered 2019-07-08: 10 mg via INTRAVENOUS

## 2019-07-08 MED ORDER — PHENOL 1.4 % MT LIQD
1.0000 | OROMUCOSAL | Status: DC | PRN
  Administered 2019-07-08: 1 via OROMUCOSAL
  Filled 2019-07-08: qty 177

## 2019-07-08 MED ORDER — HYDRALAZINE HCL 20 MG/ML IJ SOLN
INTRAMUSCULAR | Status: AC
  Filled 2019-07-08: qty 1

## 2019-07-08 MED ORDER — LABETALOL HCL 5 MG/ML IV SOLN
5.0000 mg | Freq: Once | INTRAVENOUS | Status: AC
  Administered 2019-07-08: 5 mg via INTRAVENOUS

## 2019-07-08 MED ORDER — FENTANYL CITRATE (PF) 250 MCG/5ML IJ SOLN
INTRAMUSCULAR | Status: DC | PRN
  Administered 2019-07-08 (×9): 50 ug via INTRAVENOUS

## 2019-07-08 MED ORDER — SUGAMMADEX SODIUM 500 MG/5ML IV SOLN
INTRAVENOUS | Status: DC | PRN
  Administered 2019-07-08: 300 mg via INTRAVENOUS

## 2019-07-08 MED ORDER — ACETAMINOPHEN 10 MG/ML IV SOLN: 1000.0000 mg | Freq: Once | INTRAVENOUS | Status: AC

## 2019-07-08 MED ORDER — SODIUM CHLORIDE 0.9% FLUSH: 9.0000 mL | INTRAVENOUS | Status: DC | PRN

## 2019-07-08 MED ORDER — HYDROMORPHONE HCL 1 MG/ML IJ SOLN
0.2500 mg | INTRAMUSCULAR | Status: DC | PRN
  Administered 2019-07-08 (×3): 0.5 mg via INTRAVENOUS

## 2019-07-08 MED ORDER — LABETALOL HCL 5 MG/ML IV SOLN: 5.0000 mg | Freq: Once | INTRAVENOUS | Status: AC

## 2019-07-08 MED ORDER — LABETALOL HCL 5 MG/ML IV SOLN
INTRAVENOUS | Status: AC
  Filled 2019-07-08: qty 4

## 2019-07-08 MED ORDER — ONDANSETRON HCL 4 MG/2ML IJ SOLN
4.0000 mg | INTRAMUSCULAR | Status: DC | PRN
  Administered 2019-07-09: 4 mg via INTRAVENOUS
  Filled 2019-07-08: qty 2

## 2019-07-08 MED ORDER — ONDANSETRON HCL 4 MG/2ML IJ SOLN
INTRAMUSCULAR | Status: AC
  Filled 2019-07-08: qty 2

## 2019-07-08 SURGICAL SUPPLY — 38 items
APPLICATOR COTTON TIP 6 STRL (MISCELLANEOUS) ×1 IMPLANT
APPLICATOR COTTON TIP 6IN STRL (MISCELLANEOUS) ×3
BLADE EXTENDED COATED 6.5IN (ELECTRODE) IMPLANT
BLADE HEX COATED 2.75 (ELECTRODE) ×3 IMPLANT
COVER MAYO STAND STRL (DRAPES) ×3 IMPLANT
COVER WAND RF STERILE (DRAPES) IMPLANT
DRAPE LAPAROSCOPIC ABDOMINAL (DRAPES) ×3 IMPLANT
DRAPE WARM FLUID 44X44 (DRAPES) ×3 IMPLANT
DRSG OPSITE POSTOP 4X10 (GAUZE/BANDAGES/DRESSINGS) ×3 IMPLANT
ELECT REM PT RETURN 15FT ADLT (MISCELLANEOUS) ×3 IMPLANT
GAUZE SPONGE 4X4 12PLY STRL (GAUZE/BANDAGES/DRESSINGS) ×3 IMPLANT
GLOVE BIOGEL PI IND STRL 7.0 (GLOVE) ×1 IMPLANT
GLOVE BIOGEL PI INDICATOR 7.0 (GLOVE) ×2
GLOVE INDICATOR 8.0 STRL GRN (GLOVE) ×6 IMPLANT
GLOVE SS BIOGEL STRL SZ 7.5 (GLOVE) ×1 IMPLANT
GLOVE SUPERSENSE BIOGEL SZ 7.5 (GLOVE) ×2
GOWN STRL REUS W/TWL LRG LVL3 (GOWN DISPOSABLE) ×3 IMPLANT
GOWN STRL REUS W/TWL XL LVL3 (GOWN DISPOSABLE) ×6 IMPLANT
HANDLE SUCTION POOLE (INSTRUMENTS) ×1 IMPLANT
KIT BASIN OR (CUSTOM PROCEDURE TRAY) ×3 IMPLANT
KIT TURNOVER KIT A (KITS) IMPLANT
NS IRRIG 1000ML POUR BTL (IV SOLUTION) ×3 IMPLANT
PACK GENERAL/GYN (CUSTOM PROCEDURE TRAY) ×3 IMPLANT
PENCIL SMOKE EVACUATOR (MISCELLANEOUS) IMPLANT
SPONGE LAP 18X18 RF (DISPOSABLE) IMPLANT
STAPLER VISISTAT 35W (STAPLE) ×3 IMPLANT
SUCTION POOLE HANDLE (INSTRUMENTS) ×3
SUT PDS AB 1 CTX 36 (SUTURE) IMPLANT
SUT SILK 2 0 (SUTURE)
SUT SILK 2-0 18XBRD TIE 12 (SUTURE) IMPLANT
SUT SILK 3 0 (SUTURE)
SUT SILK 3 0 SH CR/8 (SUTURE) ×3 IMPLANT
SUT SILK 3-0 18XBRD TIE 12 (SUTURE) IMPLANT
SUT VIC AB 2-0 SH 18 (SUTURE) ×3 IMPLANT
SUT VIC AB 3-0 SH 18 (SUTURE) IMPLANT
TOWEL OR 17X26 10 PK STRL BLUE (TOWEL DISPOSABLE) ×6 IMPLANT
TRAY FOLEY MTR SLVR 16FR STAT (SET/KITS/TRAYS/PACK) ×3 IMPLANT
YANKAUER SUCT BULB TIP NO VENT (SUCTIONS) IMPLANT

## 2019-07-08 NOTE — Anesthesia Procedure Notes (Signed)
Procedure Name: Intubation Performed by: West Pugh, CRNA Pre-anesthesia Checklist: Patient identified, Emergency Drugs available, Suction available, Patient being monitored and Timeout performed Patient Re-evaluated:Patient Re-evaluated prior to induction Oxygen Delivery Method: Circle system utilized Preoxygenation: Pre-oxygenation with 100% oxygen Induction Type: IV induction, Rapid sequence and Cricoid Pressure applied Laryngoscope Size: Mac and 4 Grade View: Grade II Tube type: Oral Tube size: 7.5 mm Number of attempts: 1 Airway Equipment and Method: Stylet Placement Confirmation: ETT inserted through vocal cords under direct vision,  positive ETCO2,  CO2 detector and breath sounds checked- equal and bilateral Secured at: 22 cm Tube secured with: Tape Dental Injury: Teeth and Oropharynx as per pre-operative assessment
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
## 2019-07-08 NOTE — Anesthesia Preprocedure Evaluation (Signed)
Anesthesia Evaluation  Patient identified by MRN, date of birth, ID band Patient awake    Reviewed: Allergy & Precautions, NPO status , Patient's Chart, lab work & pertinent test results  Airway Mallampati: II  TM Distance: >3 FB     Dental   Pulmonary Current Smoker and Patient abstained from smoking.,    breath sounds clear to auscultation       Cardiovascular hypertension,  Rhythm:Regular Rate:Normal     Neuro/Psych    GI/Hepatic Neg liver ROS,   Endo/Other  negative endocrine ROS  Renal/GU negative Renal ROS     Musculoskeletal   Abdominal   Peds  Hematology   Anesthesia Other Findings   Reproductive/Obstetrics                             Anesthesia Physical Anesthesia Plan  ASA: III  Anesthesia Plan: General   Post-op Pain Management:    Induction: Intravenous  PONV Risk Score and Plan: Ondansetron, Dexamethasone and Midazolam  Airway Management Planned: Oral ETT  Additional Equipment:   Intra-op Plan:   Post-operative Plan: Possible Post-op intubation/ventilation  Informed Consent: I have reviewed the patients History and Physical, chart, labs and discussed the procedure including the risks, benefits and alternatives for the proposed anesthesia with the patient or authorized representative who has indicated his/her understanding and acceptance.     Dental advisory given  Plan Discussed with: CRNA and Anesthesiologist  Anesthesia Plan Comments:         Anesthesia Quick Evaluation

## 2019-07-08 NOTE — Transfer of Care (Signed)
Immediate Anesthesia Transfer of Care Note  Patient: Oscar Roberts  Procedure(s) Performed: EXPLORATORY LAPAROTOMY; LYSIS OF ADHESIONS (N/A Abdomen)  Patient Location: PACU  Anesthesia Type:General  Level of Consciousness: awake  Airway & Oxygen Therapy: Patient Spontanous Breathing and Patient connected to face mask oxygen  Post-op Assessment: Report given to RN and Post -op Vital signs reviewed and stable  Post vital signs: Reviewed and stable  Last Vitals:  Vitals Value Taken Time  BP 170/105 07/08/19 1449  Temp 36.8 C 07/08/19 1445  Pulse 96 07/08/19 1451  Resp 14 07/08/19 1451  SpO2 99 % 07/08/19 1451  Vitals shown include unvalidated device data.  Last Pain:  Vitals:   07/08/19 1445  TempSrc:   PainSc: Asleep      Patients Stated Pain Goal: 2 (50/35/46 5681)  Complications: No apparent anesthesia complications

## 2019-07-08 NOTE — Plan of Care (Signed)
  Problem: Education: Goal: Knowledge of General Education information will improve Description: Including pain rating scale, medication(s)/side effects and non-pharmacologic comfort measures 07/08/2019 1836 by Audrea Muscat, RN Outcome: Progressing 07/08/2019 1835 by Audrea Muscat, RN Outcome: Progressing   Problem: Health Behavior/Discharge Planning: Goal: Ability to manage health-related needs will improve 07/08/2019 1836 by Audrea Muscat, RN Outcome: Progressing 07/08/2019 1835 by Audrea Muscat, RN Outcome: Progressing   Problem: Clinical Measurements: Goal: Ability to maintain clinical measurements within normal limits will improve 07/08/2019 1836 by Audrea Muscat, RN Outcome: Progressing 07/08/2019 1835 by Audrea Muscat, RN Outcome: Progressing Goal: Will remain free from infection 07/08/2019 1836 by Audrea Muscat, RN Outcome: Progressing 07/08/2019 1835 by Audrea Muscat, RN Outcome: Progressing Goal: Diagnostic test results will improve 07/08/2019 1836 by Audrea Muscat, RN Outcome: Progressing 07/08/2019 1835 by Audrea Muscat, RN Outcome: Progressing Goal: Respiratory complications will improve 07/08/2019 1836 by Audrea Muscat, RN Outcome: Progressing 07/08/2019 1835 by Audrea Muscat, RN Outcome: Progressing Goal: Cardiovascular complication will be avoided 07/08/2019 1836 by Audrea Muscat, RN Outcome: Progressing 07/08/2019 1835 by Audrea Muscat, RN Outcome: Progressing   Problem: Activity: Goal: Risk for activity intolerance will decrease 07/08/2019 1836 by Audrea Muscat, RN Outcome: Progressing 07/08/2019 1835 by Audrea Muscat, RN Outcome: Progressing   Problem: Nutrition: Goal: Adequate nutrition will be maintained 07/08/2019 1836 by Audrea Muscat, RN Outcome: Progressing 07/08/2019 1835 by Audrea Muscat, RN Outcome: Progressing   Problem: Coping: Goal: Level of anxiety will decrease 07/08/2019  1836 by Audrea Muscat, RN Outcome: Progressing 07/08/2019 1835 by Audrea Muscat, RN Outcome: Progressing   Problem: Elimination: Goal: Will not experience complications related to bowel motility 07/08/2019 1836 by Audrea Muscat, RN Outcome: Progressing 07/08/2019 1835 by Audrea Muscat, RN Outcome: Progressing Goal: Will not experience complications related to urinary retention 07/08/2019 1836 by Audrea Muscat, RN Outcome: Progressing 07/08/2019 1835 by Audrea Muscat, RN Outcome: Progressing   Problem: Pain Managment: Goal: General experience of comfort will improve 07/08/2019 1836 by Audrea Muscat, RN Outcome: Progressing 07/08/2019 1835 by Audrea Muscat, RN Outcome: Progressing   Problem: Safety: Goal: Ability to remain free from injury will improve 07/08/2019 1836 by Audrea Muscat, RN Outcome: Progressing 07/08/2019 1835 by Audrea Muscat, RN Outcome: Progressing   Problem: Skin Integrity: Goal: Risk for impaired skin integrity will decrease 07/08/2019 1836 by Audrea Muscat, RN Outcome: Progressing 07/08/2019 1835 by Audrea Muscat, RN Outcome: Progressing   Problem: Education: Goal: Required Educational Video(s) 07/08/2019 1836 by Audrea Muscat, RN Outcome: Progressing 07/08/2019 1835 by Audrea Muscat, RN Outcome: Progressing   Problem: Clinical Measurements: Goal: Ability to maintain clinical measurements within normal limits will improve 07/08/2019 1836 by Audrea Muscat, RN Outcome: Progressing 07/08/2019 1835 by Audrea Muscat, RN Outcome: Progressing Goal: Postoperative complications will be avoided or minimized 07/08/2019 1836 by Audrea Muscat, RN Outcome: Progressing 07/08/2019 1835 by Audrea Muscat, RN Outcome: Progressing   Problem: Skin Integrity: Goal: Demonstration of wound healing without infection will improve 07/08/2019 1836 by Audrea Muscat, RN Outcome: Progressing 07/08/2019 1835 by  Audrea Muscat, RN Outcome: Progressing

## 2019-07-08 NOTE — Anesthesia Postprocedure Evaluation (Signed)
Anesthesia Post Note  Patient: Oscar Roberts  Procedure(s) Performed: EXPLORATORY LAPAROTOMY; LYSIS OF ADHESIONS (N/A Abdomen)     Patient location during evaluation: PACU Anesthesia Type: General Level of consciousness: awake Pain management: pain level controlled Vital Signs Assessment: post-procedure vital signs reviewed and stable Respiratory status: spontaneous breathing Cardiovascular status: stable Postop Assessment: no apparent nausea or vomiting Anesthetic complications: no    Last Vitals:  Vitals:   07/08/19 1758 07/08/19 1854  BP: (!) 151/92 132/90  Pulse: 96 97  Resp: 14 18  Temp:  37 C  SpO2: 99% 97%    Last Pain:  Vitals:   07/08/19 1854  TempSrc: Oral  PainSc:                  Doyle Tegethoff

## 2019-07-09 MED ORDER — METHOCARBAMOL 1000 MG/10ML IJ SOLN
500.0000 mg | Freq: Three times a day (TID) | INTRAVENOUS | Status: DC
  Administered 2019-07-09 – 2019-07-10 (×4): 500 mg via INTRAVENOUS
  Filled 2019-07-09 (×3): qty 500
  Filled 2019-07-09: qty 5

## 2019-07-09 MED ORDER — KETOROLAC TROMETHAMINE 15 MG/ML IJ SOLN
15.0000 mg | Freq: Four times a day (QID) | INTRAMUSCULAR | Status: DC
  Administered 2019-07-09 – 2019-07-10 (×4): 15 mg via INTRAVENOUS
  Filled 2019-07-09 (×4): qty 1

## 2019-07-09 MED ORDER — LORAZEPAM 2 MG/ML IJ SOLN
1.0000 mg | INTRAMUSCULAR | Status: DC | PRN
  Administered 2019-07-09: 1 mg via INTRAVENOUS
  Filled 2019-07-09: qty 1

## 2019-07-09 NOTE — Progress Notes (Signed)
Patient refuses to walk in hallway- encouraged/ educated the patient of benefits of ambulation of further distances. Patient is ambulating in the room with minimal assistance and is passing gas and has voided- see flowsheet. Call bell within reach.
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
## 2019-07-09 NOTE — Plan of Care (Signed)

## 2019-07-09 NOTE — Progress Notes (Signed)
Pt c/o insomnia unrelieved by 25mg  IV diphenhydramine & would like something else to help him sleep. Paged [facility] on-call MD. Received a call back for Dr. Christie Beckers. New orders for Ativan 1mg  IV Q4hrs PRN for anxiety/ insomnia. New orders implemented and will continue to monitor.

## 2019-07-10 LAB — CBC
HCT: 39.3 % (ref 39.0–52.0)
Hemoglobin: 13.3 g/dL (ref 13.0–17.0)
MCH: 30.9 pg (ref 26.0–34.0)
MCHC: 33.8 g/dL (ref 30.0–36.0)
MCV: 91.2 fL (ref 80.0–100.0)
Platelets: 145 10*3/uL — ABNORMAL LOW (ref 150–400)
RBC: 4.31 MIL/uL (ref 4.22–5.81)
RDW: 12.8 % (ref 11.5–15.5)
WBC: 10.5 10*3/uL (ref 4.0–10.5)
nRBC: 0 % (ref 0.0–0.2)

## 2019-07-10 LAB — BASIC METABOLIC PANEL
Anion gap: 7 (ref 5–15)
BUN: 14 mg/dL (ref 6–20)
CO2: 25 mmol/L (ref 22–32)
Calcium: 8.8 mg/dL — ABNORMAL LOW (ref 8.9–10.3)
Chloride: 107 mmol/L (ref 98–111)
Creatinine, Ser: 1 mg/dL (ref 0.61–1.24)
GFR calc Af Amer: >60 mL/min (ref >60)
GFR calc non Af Amer: >60 mL/min (ref >60)
Glucose, Bld: 107 mg/dL — ABNORMAL HIGH (ref 70–99)
Potassium: 4.2 mmol/L (ref 3.5–5.1)
Sodium: 139 mmol/L (ref 135–145)

## 2019-07-10 MED ORDER — OXYCODONE HCL 5 MG PO TABS
5.0000 mg | ORAL_TABLET | ORAL | Status: DC | PRN
  Administered 2019-07-10 – 2019-07-11 (×2): 5 mg via ORAL
  Filled 2019-07-10 (×2): qty 1

## 2019-07-10 MED ORDER — KETOROLAC TROMETHAMINE 15 MG/ML IJ SOLN
15.0000 mg | Freq: Four times a day (QID) | INTRAMUSCULAR | Status: AC
  Administered 2019-07-10 – 2019-07-11 (×4): 15 mg via INTRAVENOUS
  Filled 2019-07-10 (×4): qty 1

## 2019-07-10 MED ORDER — METHOCARBAMOL 500 MG PO TABS
500.0000 mg | ORAL_TABLET | Freq: Four times a day (QID) | ORAL | Status: DC | PRN
  Administered 2019-07-11: 500 mg via ORAL
  Filled 2019-07-10: qty 1

## 2019-07-10 NOTE — Plan of Care (Signed)
Continue current POC 

## 2019-07-10 NOTE — Progress Notes (Signed)
2 Days Post-Op   Subjective/Chief Complaint: Having flatus, no n/v with ng out, voiding pain controlled this am   Objective: Vital signs in last 24 hours: Temp:  [98.1 F (36.7 C)-98.4 F (36.9 C)] 98.1 F (36.7 C) (12/31 2230) Pulse Rate:  [88-92] 88 (12/31 2230) Resp:  [16-18] 18 (12/31 2230) BP: (122-136)/(82-86) 122/84 (12/31 2230) SpO2:  [97 %-98 %] 98 % (12/31 2230) Last BM Date: 07/06/19  Intake/Output from previous day: 12/31 0701 - 01/01 0700 In: 2317.4 [P.O.:150; I.V.:2017.4; IV Piggyback:150] Out: 1590 [Urine:1590] Intake/Output this shift: No intake/output data recorded.  Resp: clear to auscultation bilaterally Cardio: regular rate and rhythm GI: bs present wound with some drainage on dressing approp tender not distended  Lab Results:  Recent Labs    07/08/19 0457 07/10/19 0417  WBC 9.0 10.5  HGB 14.5 13.3  HCT 44.2 39.3  PLT 174 145*   BMET Recent Labs    07/08/19 0457 07/10/19 0417  NA 139 139  K 3.8 4.2  CL 107 107  CO2 26 25  GLUCOSE 98 107*  BUN 22* 14  CREATININE 1.07 1.00  CALCIUM 8.7* 8.8*   PT/INR No results for input(s): LABPROT, INR in the last 72 hours. ABG No results for input(s): PHART, HCO3 in the last 72 hours.  Invalid input(s): PCO2, PO2  Studies/Results: No results found.  Anti-infectives: Anti-infectives (From admission, onward)   Start     Dose/Rate Route Frequency Ordered Stop   07/08/19 1223  sodium chloride 0.9 % with cefoTEtan (CEFOTAN) ADS Med    Note to Pharmacy: Viviano Simas   : cabinet override      07/08/19 1223 07/08/19 1306      Assessment/Plan: POD 2 elap/loa-Cornett -will give clears and advance to fulls as tolerated -po pain meds -needs oob today, ics and aggressive pulmonary toilet HTN- prn medications Lovenox, scds for dvt prophylaxis dispo likely home next 48 hours Oscar Roberts 07/10/2019

## 2019-07-11 LAB — BASIC METABOLIC PANEL
Anion gap: 9 (ref 5–15)
BUN: 14 mg/dL (ref 6–20)
CO2: 24 mmol/L (ref 22–32)
Calcium: 9.2 mg/dL (ref 8.9–10.3)
Chloride: 104 mmol/L (ref 98–111)
Creatinine, Ser: 1.02 mg/dL (ref 0.61–1.24)
GFR calc Af Amer: >60 mL/min (ref >60)
GFR calc non Af Amer: >60 mL/min (ref >60)
Glucose, Bld: 90 mg/dL (ref 70–99)
Potassium: 4 mmol/L (ref 3.5–5.1)
Sodium: 137 mmol/L (ref 135–145)

## 2019-07-11 MED ORDER — IBUPROFEN 800 MG PO TABS: 800.0000 mg | ORAL_TABLET | Freq: Three times a day (TID) | ORAL | 1 refills | Status: DC | PRN

## 2019-07-11 MED ORDER — OXYCODONE HCL 5 MG PO TABS: 5.0000 mg | ORAL_TABLET | ORAL | 0 refills | Status: DC | PRN

## 2019-07-11 MED ORDER — METHOCARBAMOL 750 MG PO TABS: 750.0000 mg | ORAL_TABLET | Freq: Three times a day (TID) | ORAL | 0 refills | Status: DC | PRN

## 2019-07-11 NOTE — Discharge Summary (Signed)
Physician Discharge Summary  Patient ID: Icker Swigert MRN: [id number] DOB/AGE: 09/20/1975 44 y.o.  Admit date: 07/07/2019 Discharge date: 07/11/2019  Admission Diagnoses: Small bowel obstruction  Discharge Diagnoses:  Active Problems:   Partial small bowel obstruction [facility])   Discharged Condition: good  Hospital Course: 75 yom admitted and underwent laparotomy with lysis of adhesions for small obstruction by Dr Luisa Hart.  Following surgery he had removal of ng tube and foley the following day as he had return of bowel function.  He progressed to having bms and tolerating diet with good pain control.  Will discharge home  Consults: None  Significant Diagnostic Studies: ct scan  Treatments: surgery: elap with loa  Discharge Exam: Blood pressure (!) 140/91, pulse 75, temperature (!) 97.5 F (36.4 C), temperature source Oral, resp. rate 16, height 5\' 10"  (1.778 m), weight 108.9 kg, SpO2 95 %. Resp: clear to auscultation bilaterally Cardio: regular rate and rhythm GI: soft bs present approp tender wound clean without infection staples in place  Disposition: Discharge disposition: 01-Home or Self Care        Allergies as of 07/11/2019   No Known Allergies     Medication List    TAKE these medications   ibuprofen 800 MG tablet Commonly known as: ADVIL Take 1 tablet (800 mg total) by mouth every 8 (eight) hours as needed.   methocarbamol 750 MG tablet Commonly known as: ROBAXIN Take 1 tablet (750 mg total) by mouth every 8 (eight) hours as needed (use for muscle cramps/pain).   oxyCODONE 5 MG immediate release tablet Commonly known as: Oxy IR/ROXICODONE Take 1 tablet (5 mg total) by mouth every 4 (four) hours as needed for moderate pain.   zolpidem 10 MG tablet Commonly known as: AMBIEN Take 10 mg by mouth at bedtime.      Follow-up Information    Cornett, 09/08/2019, MD Follow up in 3 week(s).   Specialty: General Surgery Contact information: [address] [place] [phone]        [facility] [phone] Follow up in 1 week(s).   Specialty: General Surgery Why: we will call to setup staple removal Contact information: [address] [place] [phone]           Signed: [phone] 07/11/2019, 7:25 AM

## 2019-07-11 NOTE — Progress Notes (Signed)
Patient tolerated breakfast without complaint, Discharge instructions discussed with patient, verbalized agreement and understanding.

## 2019-07-11 NOTE — Discharge Instructions (Signed)
[facility]      [facility], [place] [phone]  OPEN ABDOMINAL SURGERY: POST OP INSTRUCTIONS  Always review your discharge instruction sheet given to you by the facility where your surgery was performed.  IF YOU HAVE DISABILITY OR FAMILY LEAVE FORMS, YOU MUST BRING THEM TO THE OFFICE FOR PROCESSING.  PLEASE DO NOT GIVE THEM TO YOUR DOCTOR.  1. A prescription for pain medication may be given to you upon discharge.  Take your pain medication as prescribed, if needed.  If narcotic pain medicine is not needed, then you may take acetaminophen (Tylenol) or ibuprofen (Advil) as needed. 2. Take your usually prescribed medications unless otherwise directed. 3. If you need a refill on your pain medication, please contact your pharmacy. They will contact our office to request authorization.  Prescriptions will not be filled after 5pm or on week-ends. 4. You should follow a light diet the first few days after arrival home, such as soup and crackers, pudding, etc.unless your doctor has advised otherwise. A high-fiber, low fat diet can be resumed as tolerated.   Be sure to include lots of fluids daily. Most patients will experience some swelling and bruising on the chest and neck area.  Ice packs will help.  Swelling and bruising can take several days to resolve 5. Most patients will experience some swelling and bruising in the area of the incision. Ice pack will help. Swelling and bruising can take several days to resolve..  6. It is common to experience some constipation if taking pain medication after surgery.  Increasing fluid intake and taking a stool softener will usually help or prevent this problem from occurring.  A mild laxative (Milk of Magnesia or Miralax) should be taken according to package directions if there are no bowel movements after 48 hours. 7.  You may have steri-strips (small skin tapes) in place directly over the incision.  These strips should be left on the skin for 7-10 days.  If your  surgeon used skin glue on the incision, you may shower in 24 hours.  The glue will flake off over the next 2-3 weeks.  Any sutures or staples will be removed at the office during your follow-up visit. You may find that a light gauze bandage over your incision may keep your staples from being rubbed or pulled. You may shower and replace the bandage daily. 8. ACTIVITIES:  You may resume regular (light) daily activities beginning the next day--such as daily self-care, walking, climbing stairs--gradually increasing activities as tolerated.  You may have sexual intercourse when it is comfortable.  Refrain from any heavy lifting or straining until approved by your doctor. a. You may drive when you no longer are taking prescription pain medication, you can comfortably wear a seatbelt, and you can safely maneuver your car and apply brakes b. Return to Work: ___________________________________ 9. You should see your doctor in the office for a follow-up appointment approximately two weeks after your surgery.  Make sure that you call for this appointment within a day or two after you arrive home to insure a convenient appointment time. OTHER INSTRUCTIONS:  _____________________________________________________________ _____________________________________________________________  WHEN TO CALL YOUR DOCTOR: 1. Fever over 101.0 2. Inability to urinate 3. Nausea and/or vomiting 4. Extreme swelling or bruising 5. Continued bleeding from incision. 6. Increased pain, redness, or drainage from the incision. 7. Difficulty swallowing or breathing  The clinic staff is available to answer your questions during regular business hours.  Please dont hesitate to call and ask to  speak to one of the nurses if you have concerns.  For further questions, please visit [email or web address]

## 2019-11-11 ENCOUNTER — Ambulatory Visit: Payer: Self-pay

## 2019-11-16 ENCOUNTER — Ambulatory Visit: Payer: Self-pay

## 2020-03-29 ENCOUNTER — Observation Stay (HOSPITAL_COMMUNITY)
Admission: EM | Admit: 2020-03-29 | Discharge: 2020-03-30 | Disposition: A | Discharge status: Home / Self Care | Payer: Self-pay | Attending: Internal Medicine | Admitting: Internal Medicine

## 2020-03-29 ENCOUNTER — Other Ambulatory Visit: Payer: Self-pay

## 2020-03-29 ENCOUNTER — Emergency Department (HOSPITAL_COMMUNITY): Payer: Self-pay

## 2020-03-29 ENCOUNTER — Encounter (HOSPITAL_COMMUNITY): Payer: Self-pay | Admitting: General Surgery

## 2020-03-29 DIAGNOSIS — R1084 Generalized abdominal pain: Principal | ICD-10-CM | POA: Insufficient documentation

## 2020-03-29 DIAGNOSIS — R109 Unspecified abdominal pain: Secondary | ICD-10-CM

## 2020-03-29 DIAGNOSIS — Z20822 Contact with and (suspected) exposure to covid-19: Secondary | ICD-10-CM | POA: Insufficient documentation

## 2020-03-29 DIAGNOSIS — I10 Essential (primary) hypertension: Secondary | ICD-10-CM | POA: Insufficient documentation

## 2020-03-29 DIAGNOSIS — F17210 Nicotine dependence, cigarettes, uncomplicated: Secondary | ICD-10-CM | POA: Insufficient documentation

## 2020-03-29 DIAGNOSIS — R112 Nausea with vomiting, unspecified: Secondary | ICD-10-CM | POA: Insufficient documentation

## 2020-03-29 LAB — CBC WITH DIFFERENTIAL/PLATELET
Abs Immature Granulocytes: 0.03 10*3/uL (ref 0.00–0.07)
Basophils Absolute: 0 10*3/uL (ref 0.0–0.1)
Basophils Relative: 0 %
Eosinophils Absolute: 0.1 10*3/uL (ref 0.0–0.5)
Eosinophils Relative: 1 %
HCT: 48.8 % (ref 39.0–52.0)
Hemoglobin: 17.2 g/dL — ABNORMAL HIGH (ref 13.0–17.0)
Immature Granulocytes: 0 %
Lymphocytes Relative: 11 %
Lymphs Abs: 1.3 10*3/uL (ref 0.7–4.0)
MCH: 31.2 pg (ref 26.0–34.0)
MCHC: 35.2 g/dL (ref 30.0–36.0)
MCV: 88.4 fL (ref 80.0–100.0)
Monocytes Absolute: 0.8 10*3/uL (ref 0.1–1.0)
Monocytes Relative: 7 %
Neutro Abs: 9.2 10*3/uL — ABNORMAL HIGH (ref 1.7–7.7)
Neutrophils Relative %: 81 %
Platelets: 187 10*3/uL (ref 150–400)
RBC: 5.52 MIL/uL (ref 4.22–5.81)
RDW: 13.2 % (ref 11.5–15.5)
WBC: 11.5 10*3/uL — ABNORMAL HIGH (ref 4.0–10.5)
nRBC: 0 % (ref 0.0–0.2)

## 2020-03-29 LAB — COMPREHENSIVE METABOLIC PANEL
ALT: 21 U/L (ref 0–44)
AST: 16 U/L (ref 15–41)
Albumin: 4.4 g/dL (ref 3.5–5.0)
Alkaline Phosphatase: 77 U/L (ref 38–126)
Anion gap: 9 (ref 5–15)
BUN: 14 mg/dL (ref 6–20)
CO2: 22 mmol/L (ref 22–32)
Calcium: 9.3 mg/dL (ref 8.9–10.3)
Chloride: 107 mmol/L (ref 98–111)
Creatinine, Ser: 0.98 mg/dL (ref 0.61–1.24)
GFR calc Af Amer: >60 mL/min (ref >60)
GFR calc non Af Amer: >60 mL/min (ref >60)
Glucose, Bld: 118 mg/dL — ABNORMAL HIGH (ref 70–99)
Potassium: 4 mmol/L (ref 3.5–5.1)
Sodium: 138 mmol/L (ref 135–145)
Total Bilirubin: 0.8 mg/dL (ref 0.3–1.2)
Total Protein: 7.5 g/dL (ref 6.5–8.1)

## 2020-03-29 LAB — URINALYSIS, ROUTINE W REFLEX MICROSCOPIC
Bilirubin Urine: NEGATIVE
Glucose, UA: NEGATIVE mg/dL
Hgb urine dipstick: NEGATIVE
Ketones, ur: 20 mg/dL — AB
Leukocytes,Ua: NEGATIVE
Nitrite: NEGATIVE
Protein, ur: NEGATIVE mg/dL
Specific Gravity, Urine: 1.015 (ref 1.005–1.030)
pH: 6 (ref 5.0–8.0)

## 2020-03-29 LAB — LIPASE, BLOOD: Lipase: 30 U/L (ref 11–51)

## 2020-03-29 LAB — MAGNESIUM: Magnesium: 2.2 mg/dL (ref 1.7–2.4)

## 2020-03-29 LAB — SARS CORONAVIRUS 2 BY RT PCR (HOSPITAL ORDER, PERFORMED IN ~~LOC~~ HOSPITAL LAB): SARS Coronavirus 2: NEGATIVE

## 2020-03-29 MED ORDER — IOHEXOL 300 MG/ML  SOLN
100.0000 mL | Freq: Once | INTRAMUSCULAR | Status: AC | PRN
  Administered 2020-03-29: 100 mL via INTRAVENOUS

## 2020-03-29 MED ORDER — ONDANSETRON HCL 4 MG/2ML IJ SOLN
4.0000 mg | Freq: Four times a day (QID) | INTRAMUSCULAR | Status: DC | PRN
  Administered 2020-03-30: 4 mg via INTRAVENOUS
  Filled 2020-03-29: qty 2

## 2020-03-29 MED ORDER — HYDROMORPHONE HCL 1 MG/ML IJ SOLN
0.5000 mg | INTRAMUSCULAR | Status: DC | PRN
  Administered 2020-03-30: 1 mg via INTRAVENOUS
  Filled 2020-03-29: qty 1

## 2020-03-29 MED ORDER — KETOROLAC TROMETHAMINE 15 MG/ML IJ SOLN
15.0000 mg | Freq: Four times a day (QID) | INTRAMUSCULAR | Status: DC | PRN
  Administered 2020-03-30 (×2): 15 mg via INTRAVENOUS
  Filled 2020-03-29 (×2): qty 1

## 2020-03-29 MED ORDER — DICYCLOMINE HCL 10 MG/5ML PO SOLN
20.0000 mg | Freq: Once | ORAL | Status: AC
  Administered 2020-03-29: 20 mg via ORAL
  Filled 2020-03-29: qty 10

## 2020-03-29 MED ORDER — ENOXAPARIN SODIUM 40 MG/0.4ML ~~LOC~~ SOLN
40.0000 mg | SUBCUTANEOUS | Status: DC
  Administered 2020-03-30: 40 mg via SUBCUTANEOUS
  Filled 2020-03-29: qty 0.4

## 2020-03-29 MED ORDER — METOPROLOL TARTRATE 5 MG/5ML IV SOLN: 5.0000 mg | Freq: Four times a day (QID) | INTRAVENOUS | Status: DC | PRN

## 2020-03-29 MED ORDER — PROCHLORPERAZINE EDISYLATE 10 MG/2ML IJ SOLN
10.0000 mg | Freq: Four times a day (QID) | INTRAMUSCULAR | Status: DC | PRN
  Administered 2020-03-29 – 2020-03-30 (×2): 10 mg via INTRAVENOUS
  Filled 2020-03-29 (×2): qty 2

## 2020-03-29 MED ORDER — ONDANSETRON HCL 4 MG PO TABS: 4.0000 mg | ORAL_TABLET | Freq: Four times a day (QID) | ORAL | Status: DC | PRN

## 2020-03-29 MED ORDER — HYDROMORPHONE HCL 1 MG/ML IJ SOLN
1.0000 mg | Freq: Once | INTRAMUSCULAR | Status: AC
  Administered 2020-03-29: 1 mg via INTRAVENOUS
  Filled 2020-03-29: qty 1

## 2020-03-29 MED ORDER — KETOROLAC TROMETHAMINE 15 MG/ML IJ SOLN
15.0000 mg | Freq: Once | INTRAMUSCULAR | Status: AC
  Administered 2020-03-29: 15 mg via INTRAVENOUS
  Filled 2020-03-29: qty 1

## 2020-03-29 MED ORDER — DOCUSATE SODIUM 100 MG PO CAPS
100.0000 mg | ORAL_CAPSULE | Freq: Two times a day (BID) | ORAL | Status: DC
  Administered 2020-03-30: 100 mg via ORAL
  Filled 2020-03-29: qty 1

## 2020-03-29 MED ORDER — POLYETHYLENE GLYCOL 3350 17 G PO PACK: 17.0000 g | PACK | Freq: Every day | ORAL | Status: DC | PRN

## 2020-03-29 MED ORDER — LACTATED RINGERS IV BOLUS
1000.0000 mL | Freq: Once | INTRAVENOUS | Status: AC
  Administered 2020-03-29: 1000 mL via INTRAVENOUS

## 2020-03-29 MED ORDER — ALUM & MAG HYDROXIDE-SIMETH 200-200-20 MG/5ML PO SUSP
30.0000 mL | Freq: Once | ORAL | Status: AC
  Administered 2020-03-29: 30 mL via ORAL
  Filled 2020-03-29: qty 30

## 2020-03-29 MED ORDER — LIDOCAINE VISCOUS HCL 2 % MT SOLN
15.0000 mL | Freq: Once | OROMUCOSAL | Status: AC
  Administered 2020-03-29: 15 mL via ORAL
  Filled 2020-03-29: qty 15

## 2020-03-29 MED ORDER — HYOSCYAMINE SULFATE 0.125 MG SL SUBL
0.2500 mg | SUBLINGUAL_TABLET | Freq: Four times a day (QID) | SUBLINGUAL | Status: AC | PRN
  Administered 2020-03-29: 0.25 mg via SUBLINGUAL
  Filled 2020-03-29: qty 2
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
## 2020-03-29 NOTE — ED Notes (Signed)
Patient stating bentyl made his pain worse. Mid-level paged.
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
## 2020-03-29 NOTE — ED Triage Notes (Signed)
Pt c/o n/v x1 week, right upper and lower quadrant pain. Hx of multiple SBO, states this feels the same

## 2020-03-29 NOTE — ED Notes (Signed)
Patient stating pain medication is not helping enough, floor coverage paged.

## 2020-03-30 LAB — CBC
HCT: 46 % (ref 39.0–52.0)
Hemoglobin: 15.9 g/dL (ref 13.0–17.0)
MCH: 31.2 pg (ref 26.0–34.0)
MCHC: 34.6 g/dL (ref 30.0–36.0)
MCV: 90.2 fL (ref 80.0–100.0)
Platelets: 172 10*3/uL (ref 150–400)
RBC: 5.1 MIL/uL (ref 4.22–5.81)
RDW: 13.2 % (ref 11.5–15.5)
WBC: 10.5 10*3/uL (ref 4.0–10.5)
nRBC: 0 % (ref 0.0–0.2)

## 2020-03-30 LAB — COMPREHENSIVE METABOLIC PANEL
ALT: 19 U/L (ref 0–44)
AST: 15 U/L (ref 15–41)
Albumin: 4 g/dL (ref 3.5–5.0)
Alkaline Phosphatase: 69 U/L (ref 38–126)
Anion gap: 9 (ref 5–15)
BUN: 15 mg/dL (ref 6–20)
CO2: 25 mmol/L (ref 22–32)
Calcium: 9 mg/dL (ref 8.9–10.3)
Chloride: 103 mmol/L (ref 98–111)
Creatinine, Ser: 1.02 mg/dL (ref 0.61–1.24)
GFR calc Af Amer: >60 mL/min (ref >60)
GFR calc non Af Amer: >60 mL/min (ref >60)
Glucose, Bld: 104 mg/dL — ABNORMAL HIGH (ref 70–99)
Potassium: 4 mmol/L (ref 3.5–5.1)
Sodium: 137 mmol/L (ref 135–145)
Total Bilirubin: 1.2 mg/dL (ref 0.3–1.2)
Total Protein: 6.8 g/dL (ref 6.5–8.1)

## 2020-03-30 MED ORDER — HYOSCYAMINE SULFATE 0.125 MG SL SUBL
0.2500 mg | SUBLINGUAL_TABLET | Freq: Four times a day (QID) | SUBLINGUAL | Status: DC | PRN
  Administered 2020-03-30: 0.25 mg via SUBLINGUAL
  Filled 2020-03-30: qty 2

## 2020-03-30 MED ORDER — POLYETHYLENE GLYCOL 3350 17 G PO PACK: 17.0000 g | PACK | Freq: Every day | ORAL | 0 refills | Status: AC | PRN

## 2020-03-30 MED ORDER — ONDANSETRON HCL 4 MG PO TABS: 4.0000 mg | ORAL_TABLET | Freq: Four times a day (QID) | ORAL | 0 refills | Status: DC | PRN

## 2020-03-30 NOTE — ED Notes (Signed)
Patient provided with saltines to attempt a PO challenge.
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
## 2020-04-01 ENCOUNTER — Encounter (HOSPITAL_COMMUNITY): Payer: Self-pay

## 2020-04-01 ENCOUNTER — Emergency Department (HOSPITAL_COMMUNITY): Payer: Self-pay

## 2020-04-01 ENCOUNTER — Other Ambulatory Visit: Payer: Self-pay

## 2020-04-01 ENCOUNTER — Emergency Department (HOSPITAL_COMMUNITY)
Admission: EM | Admit: 2020-04-01 | Discharge: 2020-04-01 | Disposition: A | Discharge status: Home / Self Care | Payer: Self-pay | Attending: Emergency Medicine | Admitting: Emergency Medicine

## 2020-04-01 DIAGNOSIS — Z87891 Personal history of nicotine dependence: Secondary | ICD-10-CM | POA: Insufficient documentation

## 2020-04-01 DIAGNOSIS — I10 Essential (primary) hypertension: Secondary | ICD-10-CM | POA: Insufficient documentation

## 2020-04-01 DIAGNOSIS — R1084 Generalized abdominal pain: Secondary | ICD-10-CM | POA: Insufficient documentation

## 2020-04-01 DIAGNOSIS — R112 Nausea with vomiting, unspecified: Secondary | ICD-10-CM | POA: Insufficient documentation

## 2020-04-01 LAB — URINALYSIS, ROUTINE W REFLEX MICROSCOPIC
Bacteria, UA: NONE SEEN
Bilirubin Urine: NEGATIVE
Glucose, UA: NEGATIVE mg/dL
Ketones, ur: NEGATIVE mg/dL
Leukocytes,Ua: NEGATIVE
Nitrite: NEGATIVE
Protein, ur: 30 mg/dL — AB
Specific Gravity, Urine: 1.017 (ref 1.005–1.030)
pH: 5 (ref 5.0–8.0)

## 2020-04-01 LAB — COMPREHENSIVE METABOLIC PANEL
ALT: 18 U/L (ref 0–44)
AST: 15 U/L (ref 15–41)
Albumin: 4.5 g/dL (ref 3.5–5.0)
Alkaline Phosphatase: 77 U/L (ref 38–126)
Anion gap: 11 (ref 5–15)
BUN: 13 mg/dL (ref 6–20)
CO2: 25 mmol/L (ref 22–32)
Calcium: 9.9 mg/dL (ref 8.9–10.3)
Chloride: 102 mmol/L (ref 98–111)
Creatinine, Ser: 1.04 mg/dL (ref 0.61–1.24)
GFR calc Af Amer: >60 mL/min (ref >60)
GFR calc non Af Amer: >60 mL/min (ref >60)
Glucose, Bld: 108 mg/dL — ABNORMAL HIGH (ref 70–99)
Potassium: 3.6 mmol/L (ref 3.5–5.1)
Sodium: 138 mmol/L (ref 135–145)
Total Bilirubin: 0.6 mg/dL (ref 0.3–1.2)
Total Protein: 7.7 g/dL (ref 6.5–8.1)

## 2020-04-01 LAB — CBC
HCT: 51.9 % (ref 39.0–52.0)
Hemoglobin: 18.1 g/dL — ABNORMAL HIGH (ref 13.0–17.0)
MCH: 30.9 pg (ref 26.0–34.0)
MCHC: 34.9 g/dL (ref 30.0–36.0)
MCV: 88.6 fL (ref 80.0–100.0)
Platelets: 179 10*3/uL (ref 150–400)
RBC: 5.86 MIL/uL — ABNORMAL HIGH (ref 4.22–5.81)
RDW: 12.8 % (ref 11.5–15.5)
WBC: 11.1 10*3/uL — ABNORMAL HIGH (ref 4.0–10.5)
nRBC: 0 % (ref 0.0–0.2)

## 2020-04-01 LAB — LIPASE, BLOOD: Lipase: 38 U/L (ref 11–51)

## 2020-04-01 MED ORDER — ALUM & MAG HYDROXIDE-SIMETH 200-200-20 MG/5ML PO SUSP
30.0000 mL | Freq: Once | ORAL | Status: AC
  Administered 2020-04-01: 30 mL via ORAL
  Filled 2020-04-01: qty 30

## 2020-04-01 MED ORDER — HYDROMORPHONE HCL 1 MG/ML IJ SOLN
1.0000 mg | Freq: Once | INTRAMUSCULAR | Status: AC
  Administered 2020-04-01: 1 mg via INTRAVENOUS
  Filled 2020-04-01: qty 1

## 2020-04-01 MED ORDER — FENTANYL CITRATE (PF) 100 MCG/2ML IJ SOLN
50.0000 ug | Freq: Once | INTRAMUSCULAR | Status: AC
  Administered 2020-04-01: 50 ug via INTRAVENOUS
  Filled 2020-04-01: qty 2

## 2020-04-01 MED ORDER — DICYCLOMINE HCL 20 MG PO TABS: 20.0000 mg | ORAL_TABLET | Freq: Two times a day (BID) | ORAL | 0 refills | Status: AC | PRN

## 2020-04-01 MED ORDER — ONDANSETRON HCL 4 MG/2ML IJ SOLN
4.0000 mg | Freq: Once | INTRAMUSCULAR | Status: AC
  Administered 2020-04-01: 4 mg via INTRAVENOUS
  Filled 2020-04-01: qty 2

## 2020-04-01 MED ORDER — LIDOCAINE VISCOUS HCL 2 % MT SOLN
15.0000 mL | Freq: Once | OROMUCOSAL | Status: AC
  Administered 2020-04-01: 15 mL via ORAL
  Filled 2020-04-01: qty 15
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
## 2020-04-01 NOTE — ED Triage Notes (Signed)
Patient c/o mid abdominal pain and  N/D. Patient c/o mid abdominal pain.  Patient states he was discharged from the hospital 2 days ago. Patient stated, "I have taken all of the medication that they prescribed and I am still having pain."

## 2020-04-01 NOTE — ED Notes (Signed)
ED Provider at bedside. 

## 2020-04-01 NOTE — Discharge Instructions (Addendum)
You have been seen here for abdominal pain.  Lab work and imaging all look reassuring.  I prescribed you medication called Bentyl will help with spasms in your abdomen please take as prescribed.  I also want you to continue with your MiraLAX and Zofran as needed for nausea.  I have given you information for a soft food diet please continue and slowly advance your diet as tolerating.  I have given the contact information for your general surgeon who performed your surgery in 2020 I need you too contact them for further evaluation.  I have also given the contact information for a GI specialist please call them as well for a follow-up appointment.  I want you to come back to the emergency department if you develop severe chest pain, shortness of breath, severe abdominal pain, uncontrolled nausea, vomiting, diarrhea as these symptoms acquire further evaluation management.

## 2020-04-01 NOTE — ED Notes (Signed)
RN entered room to provide discharge paperwork to pt.  Pt displeased at discharge disposition, stating "I'm still in pain, I can't leave here like this."  EDP Chrissie Noa made aware.

## 2020-04-01 NOTE — ED Notes (Signed)
Pt ambulated to ED exit prior to reviewing of discharge paperwork.  Pt informed that PIV needed to be removed prior to departure, pt then removed own PIV in middle of hallway while exiting ED.  Pt declined discharge paperwork.  NAD, RA.

## 2020-07-28 ENCOUNTER — Emergency Department (HOSPITAL_COMMUNITY): Payer: BC Managed Care – PPO

## 2020-07-28 ENCOUNTER — Encounter (HOSPITAL_COMMUNITY): Payer: Self-pay

## 2020-07-28 ENCOUNTER — Emergency Department (HOSPITAL_COMMUNITY)
Admission: EM | Admit: 2020-07-28 | Discharge: 2020-07-28 | Disposition: A | Discharge status: Home / Self Care | Payer: BC Managed Care – PPO | Attending: Emergency Medicine | Admitting: Emergency Medicine

## 2020-07-28 DIAGNOSIS — Z87891 Personal history of nicotine dependence: Secondary | ICD-10-CM | POA: Diagnosis not present

## 2020-07-28 DIAGNOSIS — R109 Unspecified abdominal pain: Secondary | ICD-10-CM | POA: Diagnosis present

## 2020-07-28 DIAGNOSIS — Z20822 Contact with and (suspected) exposure to covid-19: Secondary | ICD-10-CM | POA: Insufficient documentation

## 2020-07-28 DIAGNOSIS — K2970 Gastritis, unspecified, without bleeding: Secondary | ICD-10-CM | POA: Insufficient documentation

## 2020-07-28 DIAGNOSIS — Z8719 Personal history of other diseases of the digestive system: Secondary | ICD-10-CM | POA: Diagnosis not present

## 2020-07-28 DIAGNOSIS — I10 Essential (primary) hypertension: Secondary | ICD-10-CM | POA: Insufficient documentation

## 2020-07-28 DIAGNOSIS — R1011 Right upper quadrant pain: Secondary | ICD-10-CM

## 2020-07-28 LAB — COMPREHENSIVE METABOLIC PANEL
ALT: 25 U/L (ref 0–44)
AST: 18 U/L (ref 15–41)
Albumin: 4.2 g/dL (ref 3.5–5.0)
Alkaline Phosphatase: 89 U/L (ref 38–126)
Anion gap: 11 (ref 5–15)
BUN: 14 mg/dL (ref 6–20)
CO2: 22 mmol/L (ref 22–32)
Calcium: 9.7 mg/dL (ref 8.9–10.3)
Chloride: 103 mmol/L (ref 98–111)
Creatinine, Ser: 1.02 mg/dL (ref 0.61–1.24)
GFR, Estimated: >60 mL/min (ref >60)
Glucose, Bld: 128 mg/dL — ABNORMAL HIGH (ref 70–99)
Potassium: 4.1 mmol/L (ref 3.5–5.1)
Sodium: 136 mmol/L (ref 135–145)
Total Bilirubin: 0.9 mg/dL (ref 0.3–1.2)
Total Protein: 7.4 g/dL (ref 6.5–8.1)

## 2020-07-28 LAB — CBC
HCT: 49.3 % (ref 39.0–52.0)
Hemoglobin: 17.5 g/dL — ABNORMAL HIGH (ref 13.0–17.0)
MCH: 31.3 pg (ref 26.0–34.0)
MCHC: 35.5 g/dL (ref 30.0–36.0)
MCV: 88 fL (ref 80.0–100.0)
Platelets: 205 10*3/uL (ref 150–400)
RBC: 5.6 MIL/uL (ref 4.22–5.81)
RDW: 13 % (ref 11.5–15.5)
WBC: 9.2 10*3/uL (ref 4.0–10.5)
nRBC: 0 % (ref 0.0–0.2)

## 2020-07-28 LAB — URINALYSIS, ROUTINE W REFLEX MICROSCOPIC
Bilirubin Urine: NEGATIVE
Glucose, UA: NEGATIVE mg/dL
Hgb urine dipstick: NEGATIVE
Ketones, ur: NEGATIVE mg/dL
Leukocytes,Ua: NEGATIVE
Nitrite: NEGATIVE
Protein, ur: NEGATIVE mg/dL
Specific Gravity, Urine: >1.046 — ABNORMAL HIGH (ref 1.005–1.030)
pH: 6 (ref 5.0–8.0)

## 2020-07-28 LAB — RESP PANEL BY RT-PCR (FLU A&B, COVID) ARPGX2
Influenza A by PCR: NEGATIVE
Influenza B by PCR: NEGATIVE
SARS Coronavirus 2 by RT PCR: NEGATIVE

## 2020-07-28 LAB — TYPE AND SCREEN
ABO/RH(D): O POS
Antibody Screen: NEGATIVE

## 2020-07-28 LAB — LIPASE, BLOOD: Lipase: 42 U/L (ref 11–51)

## 2020-07-28 MED ORDER — LIDOCAINE VISCOUS HCL 2 % MT SOLN
15.0000 mL | Freq: Once | OROMUCOSAL | Status: AC
  Administered 2020-07-28: 15 mL via ORAL
  Filled 2020-07-28: qty 15

## 2020-07-28 MED ORDER — ONDANSETRON HCL 4 MG/2ML IJ SOLN
4.0000 mg | Freq: Once | INTRAMUSCULAR | Status: AC
  Administered 2020-07-28: 4 mg via INTRAVENOUS
  Filled 2020-07-28: qty 2

## 2020-07-28 MED ORDER — HYDROMORPHONE HCL 1 MG/ML IJ SOLN
1.0000 mg | Freq: Once | INTRAMUSCULAR | Status: AC
  Administered 2020-07-28: 1 mg via INTRAVENOUS
  Filled 2020-07-28: qty 1

## 2020-07-28 MED ORDER — PANTOPRAZOLE SODIUM 40 MG IV SOLR
40.0000 mg | Freq: Once | INTRAVENOUS | Status: AC
  Administered 2020-07-28: 40 mg via INTRAVENOUS
  Filled 2020-07-28: qty 40

## 2020-07-28 MED ORDER — OMEPRAZOLE 20 MG PO CPDR: 20.0000 mg | DELAYED_RELEASE_CAPSULE | Freq: Every day | ORAL | 0 refills | Status: AC

## 2020-07-28 MED ORDER — SODIUM CHLORIDE 0.9 % IV BOLUS
1000.0000 mL | Freq: Once | INTRAVENOUS | Status: AC
  Administered 2020-07-28: 1000 mL via INTRAVENOUS

## 2020-07-28 MED ORDER — ONDANSETRON 4 MG PO TBDP: 4.0000 mg | ORAL_TABLET | Freq: Three times a day (TID) | ORAL | 0 refills | Status: AC | PRN

## 2020-07-28 MED ORDER — IOHEXOL 300 MG/ML  SOLN
100.0000 mL | Freq: Once | INTRAMUSCULAR | Status: AC | PRN
  Administered 2020-07-28: 100 mL via INTRAVENOUS

## 2020-07-28 MED ORDER — ALUM & MAG HYDROXIDE-SIMETH 200-200-20 MG/5ML PO SUSP
30.0000 mL | Freq: Once | ORAL | Status: AC
  Administered 2020-07-28: 30 mL via ORAL
  Filled 2020-07-28: qty 30

## 2020-07-28 MED ORDER — DICYCLOMINE HCL 10 MG/ML IM SOLN
10.0000 mg | Freq: Once | INTRAMUSCULAR | Status: AC
  Administered 2020-07-28: 10 mg via INTRAMUSCULAR
  Filled 2020-07-28: qty 2

## 2020-07-28 NOTE — Discharge Instructions (Signed)
Follow-up with a gastroenterologist as planned.  If worsening bleeding lightheadedness dizziness return to the ER.
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
## 2020-07-28 NOTE — ED Notes (Signed)
Pt dranl approx of gingerale and 3 saltine crackers. No nausea/vomiting/abd pain noted.
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
## 2021-11-22 IMAGING — CT CT ABD-PELV W/ CM
2 of 4 series · 16 of 46 positions shown, 18 images · IV contrast (APPLIED)
Comparison: April 01, 2020

CLINICAL DATA: Abdominal distension

EXAM:
CT ABDOMEN AND PELVIS WITH CONTRAST
TECHNIQUE: Multidetector CT imaging of the abdomen and pelvis was performed
using the standard protocol following bolus administration of
intravenous contrast.
CONTRAST:  100mL OMNIPAQUE IOHEXOL 300 MG/ML  SOLN

[Series 3: abd/ pelvis 5.0 i30f 2 · axial · 0.81mm/px · z∈[+829,+1289]mm · 13 of 102 slices shown, 15 images]
[im 5/102  soft-tissue]
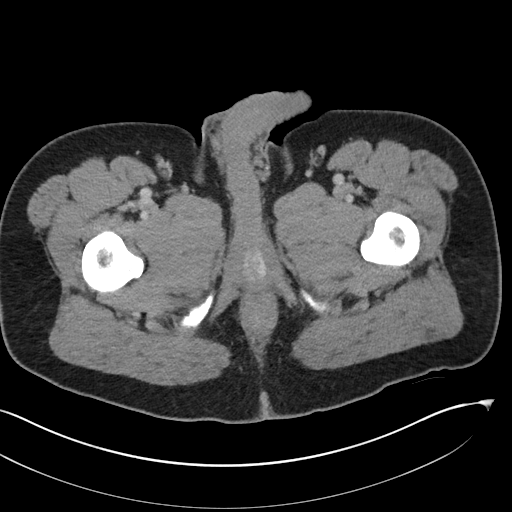
[im 5/102  bone]
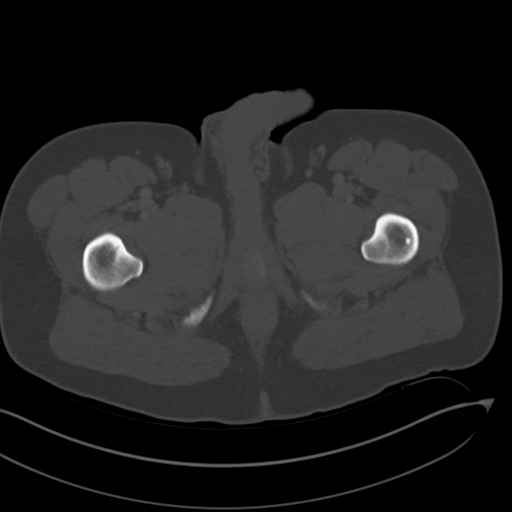
[im 14/102  soft-tissue]
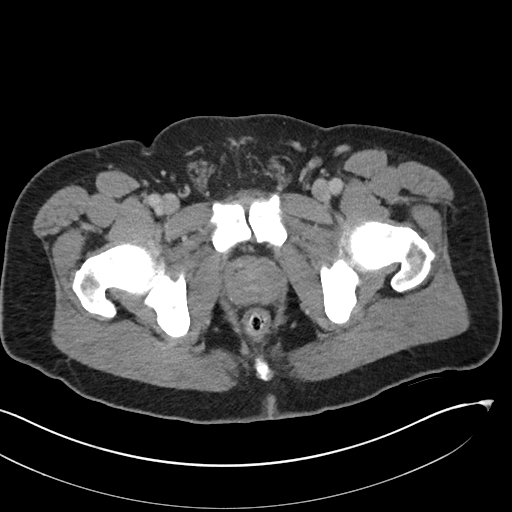
[im 22/102  soft-tissue]
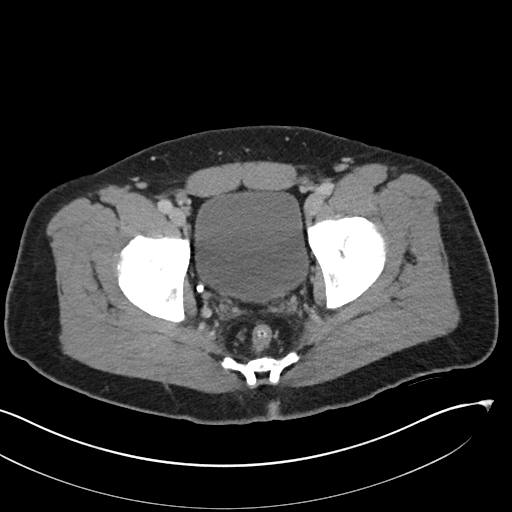
[im 27/102  soft-tissue]
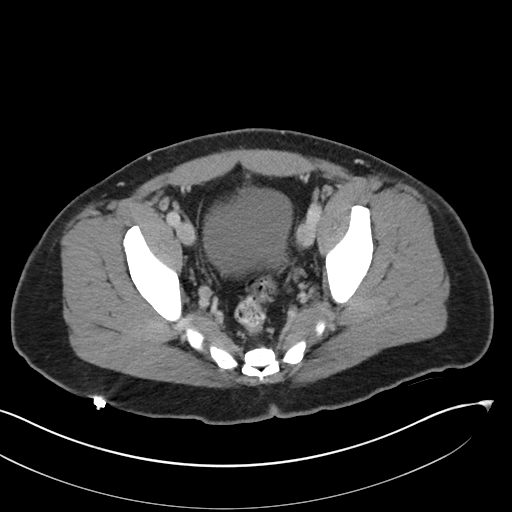
[im 36/102  soft-tissue]
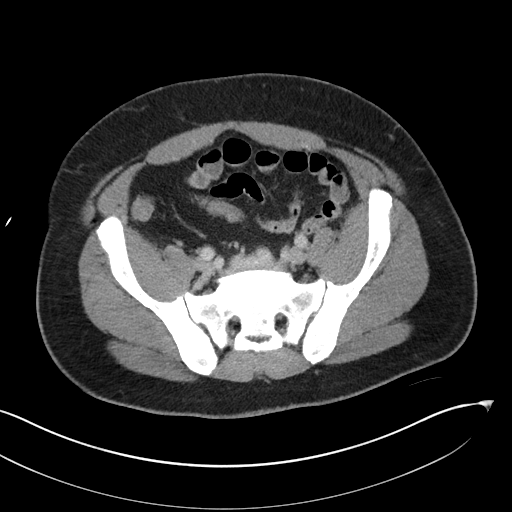
[im 44/102  soft-tissue]
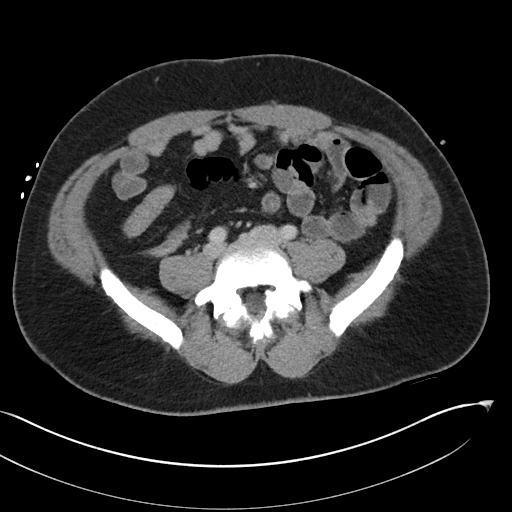
[im 53/102  soft-tissue]
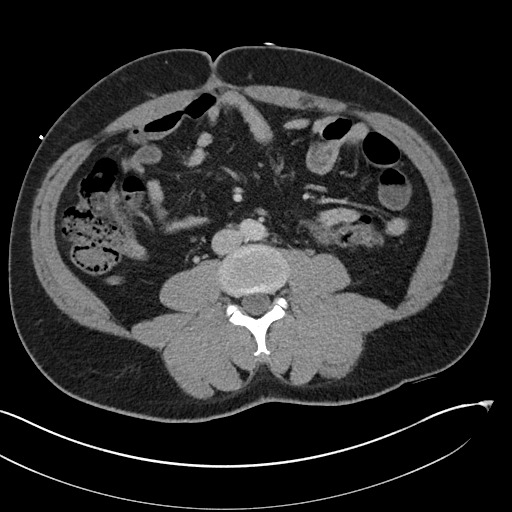
[im 58/102  soft-tissue]
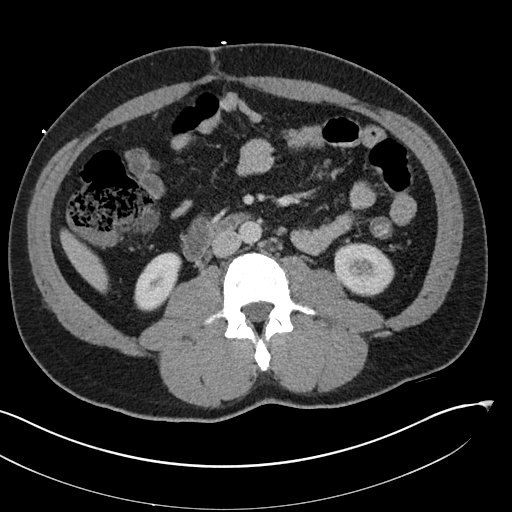
[im 66/102  soft-tissue]
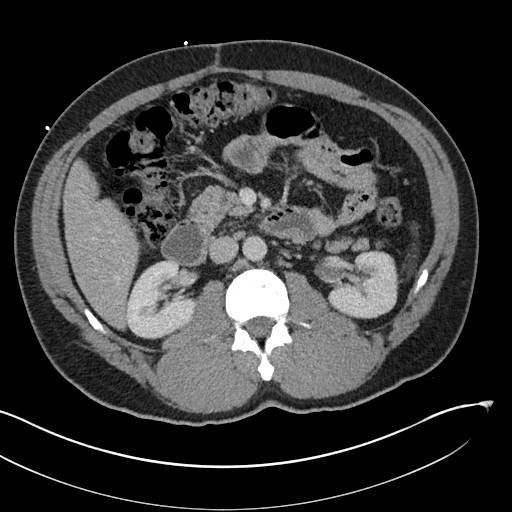
[im 66/102  bone]
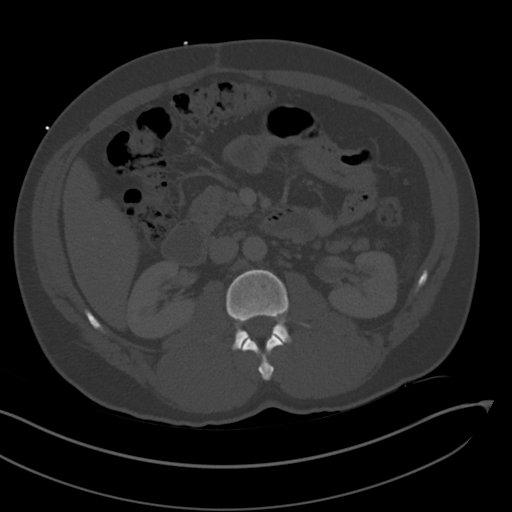
[im 75/102  soft-tissue]
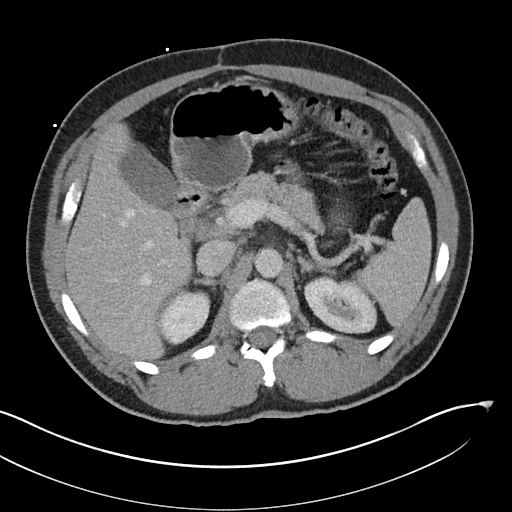
[im 80/102  soft-tissue]
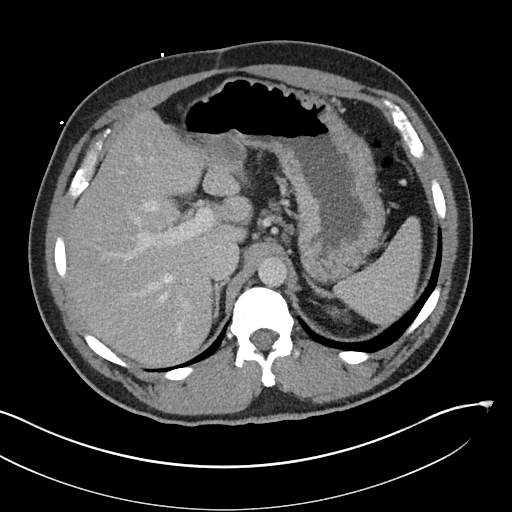
[im 88/102  soft-tissue]
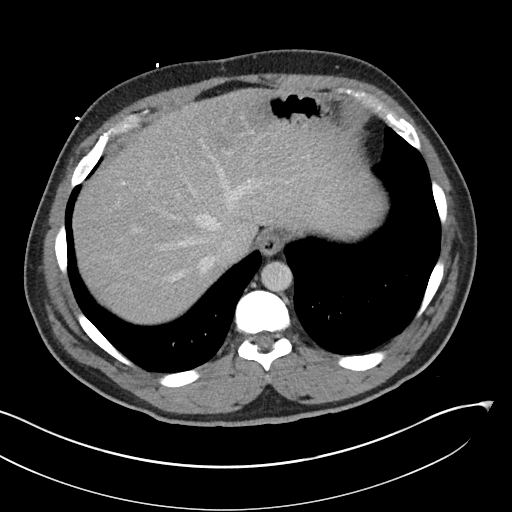
[im 97/102  soft-tissue]
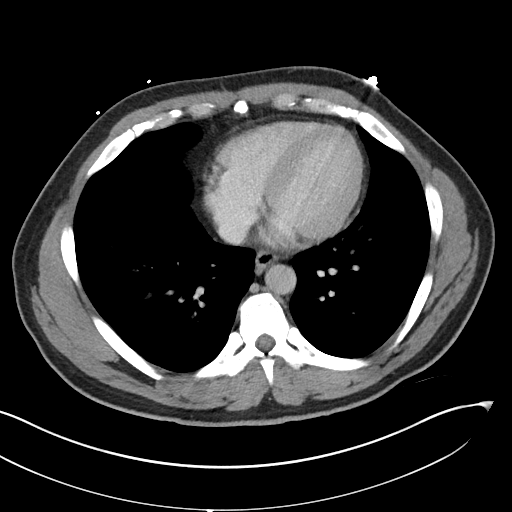

[Series 6: coronal soft tissue · coronal · 0.91mm/px · 3 of 101 slices shown]
[im 34/101  soft-tissue]
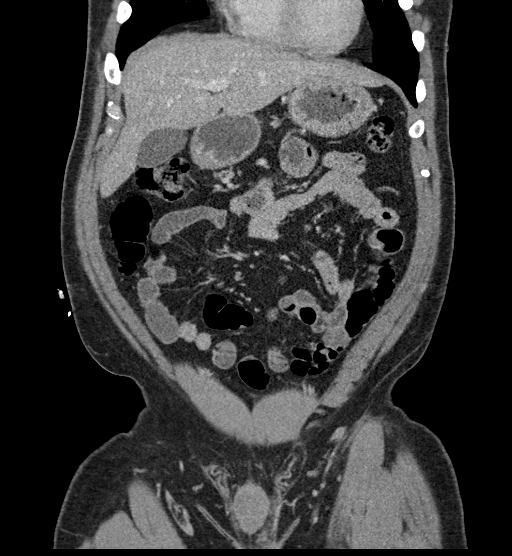
[im 45/101  soft-tissue]
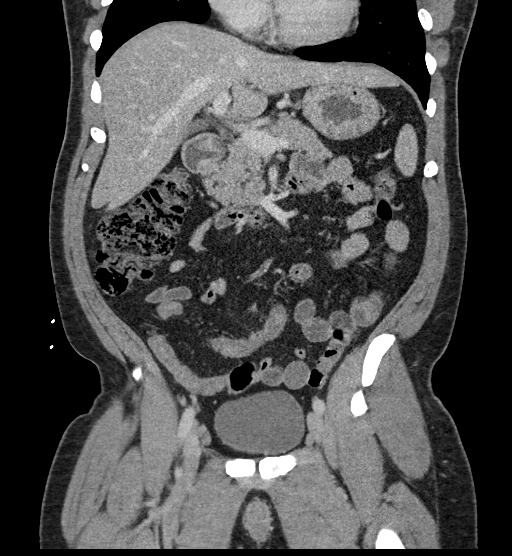
[im 56/101  soft-tissue]
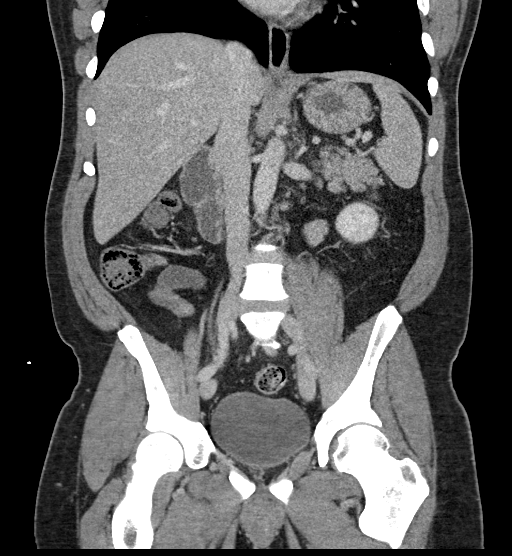

[16 of 46 positions shown; findings below may reference images not displayed]

FINDINGS: Lower chest: Lung bases are clear.

Hepatobiliary: No focal liver lesions are appreciable. Gallbladder
wall is not appreciably thickened. There is no biliary duct
dilatation.

Pancreas: There is no pancreatic mass or inflammatory focus.

Spleen: No splenic lesions are evident.

Adrenals/Urinary Tract: Adrenals bilaterally appear normal. There is
a 7 x 5 mm cyst in the medial mid right kidney. There is no
hydronephrosis on either side. There is an extrarenal pelvis on the
left, an anatomic variant. There is no evident renal or ureteral
calculus on either side. Urinary bladder is midline with wall
thickness within normal limits.

Stomach/Bowel: There is no appreciable diverticular disease. There
is moderate stool in the colon. There is suggestion of thickening of
the wall of a portion of the stomach. There is no appreciable small
or large bowel wall thickening or appreciable bowel distension. The
terminal ileum appears normal. There is no appreciable bowel
obstruction. No evident free air or portal venous air.

Vascular/Lymphatic: There is no abdominal aortic aneurysm. No
arterial vascular lesions are evident. Major venous structures
appear patent. There is no evident adenopathy in the abdomen or
pelvis.

Reproductive: Prostate and seminal vesicles are normal in size and
contour.

Other: Appendix appears normal. No evident abscess or ascites in the
abdomen or pelvis.

Musculoskeletal: There are pars defects at L5 bilaterally with 5 mm
of anterolisthesis of L5 on S1. No blastic or lytic bone lesions. No
abdominal wall or intramuscular lesions are evident.
IMPRESSION: 1. Areas of apparent gastric wall thickening concerning for
potential degree of gastritis. No small or large bowel wall
thickening or distension. No evident bowel obstruction. No free air.

2.  No abscess in the abdomen or pelvis.  Appendix appears normal.

3. No renal or ureteral calculus. No hydronephrosis on either side.
Urinary bladder wall thickness normal.

4.  Pars defects at L5 bilaterally with spondylolisthesis at L5-S1.

## 2021-11-22 IMAGING — DX DG ABDOMEN ACUTE W/ 1V CHEST
3 series · 3 of 3 positions shown · non-contrast
Comparison: July 08, 2019.

CLINICAL DATA: Right-sided abdominal pain.

EXAM:
DG ABDOMEN ACUTE WITH 1 VIEW CHEST

[chest pa]
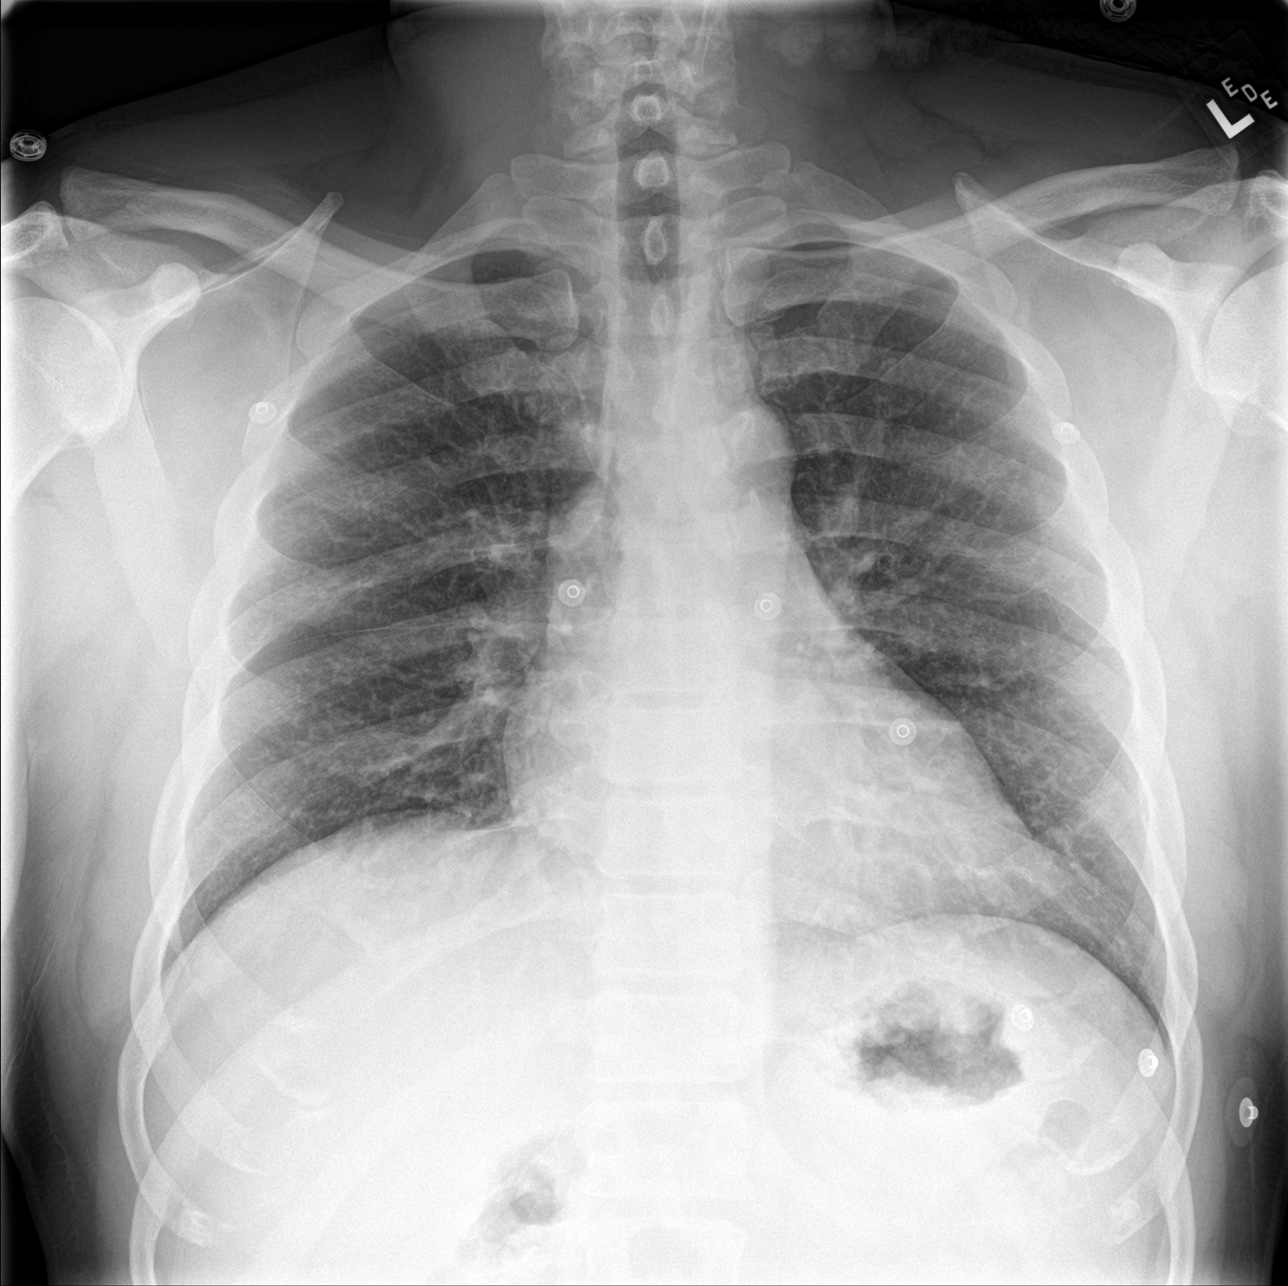

[abdomen erect]
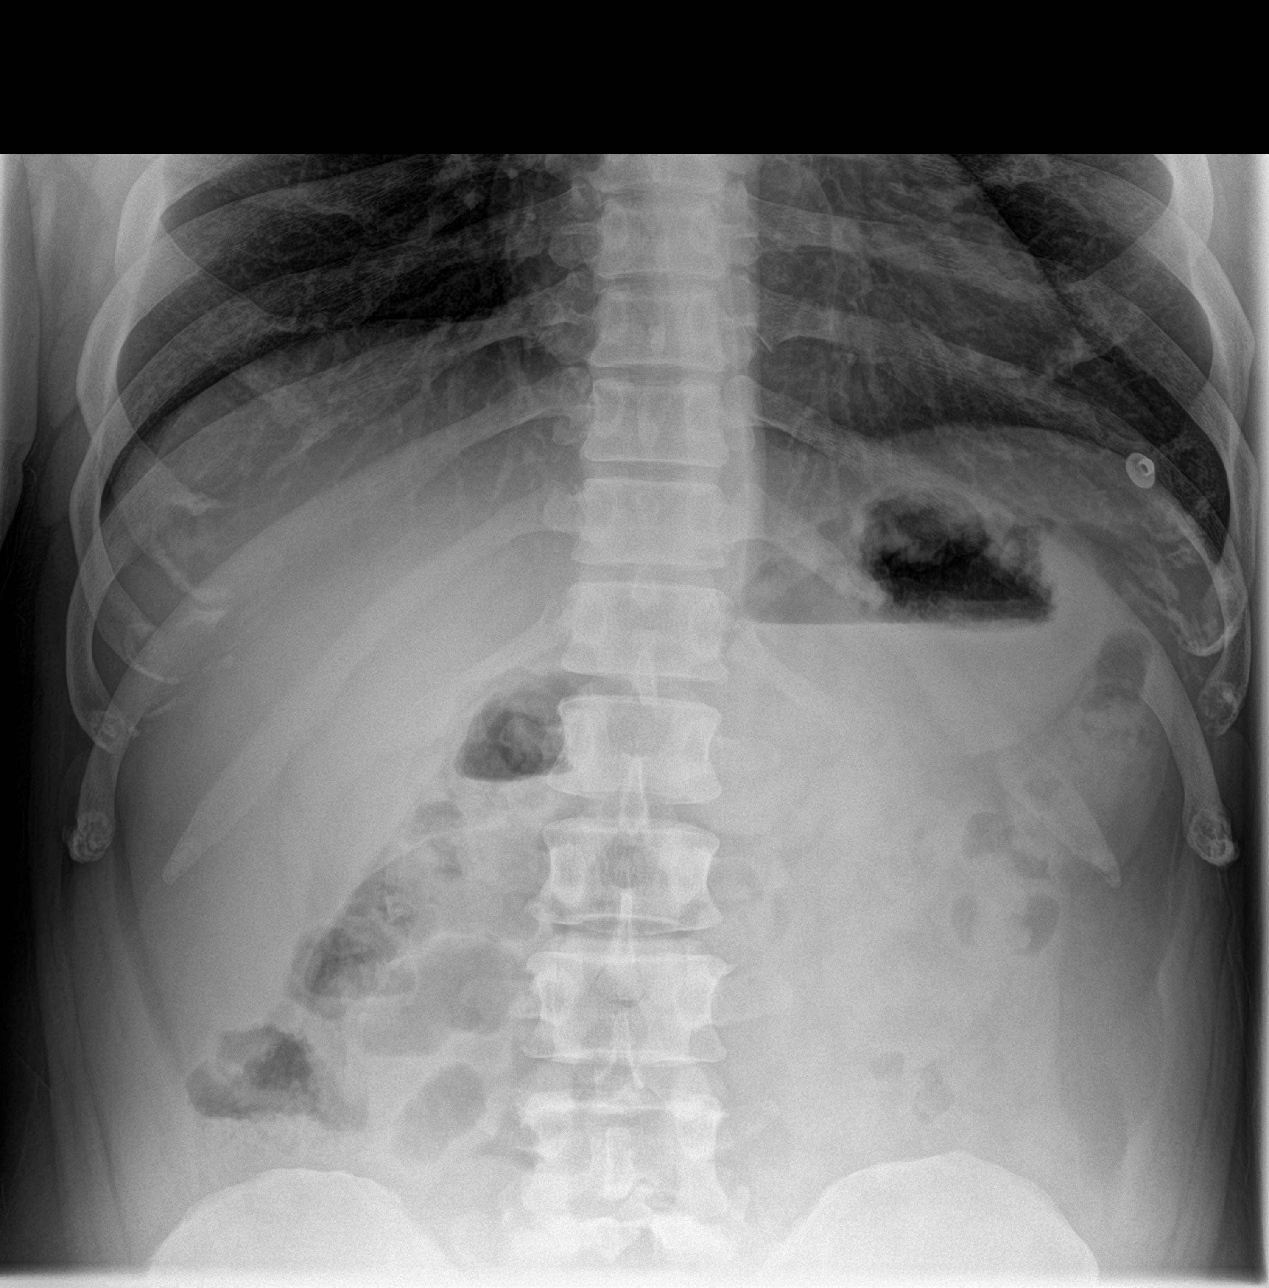

[abdomen supine]
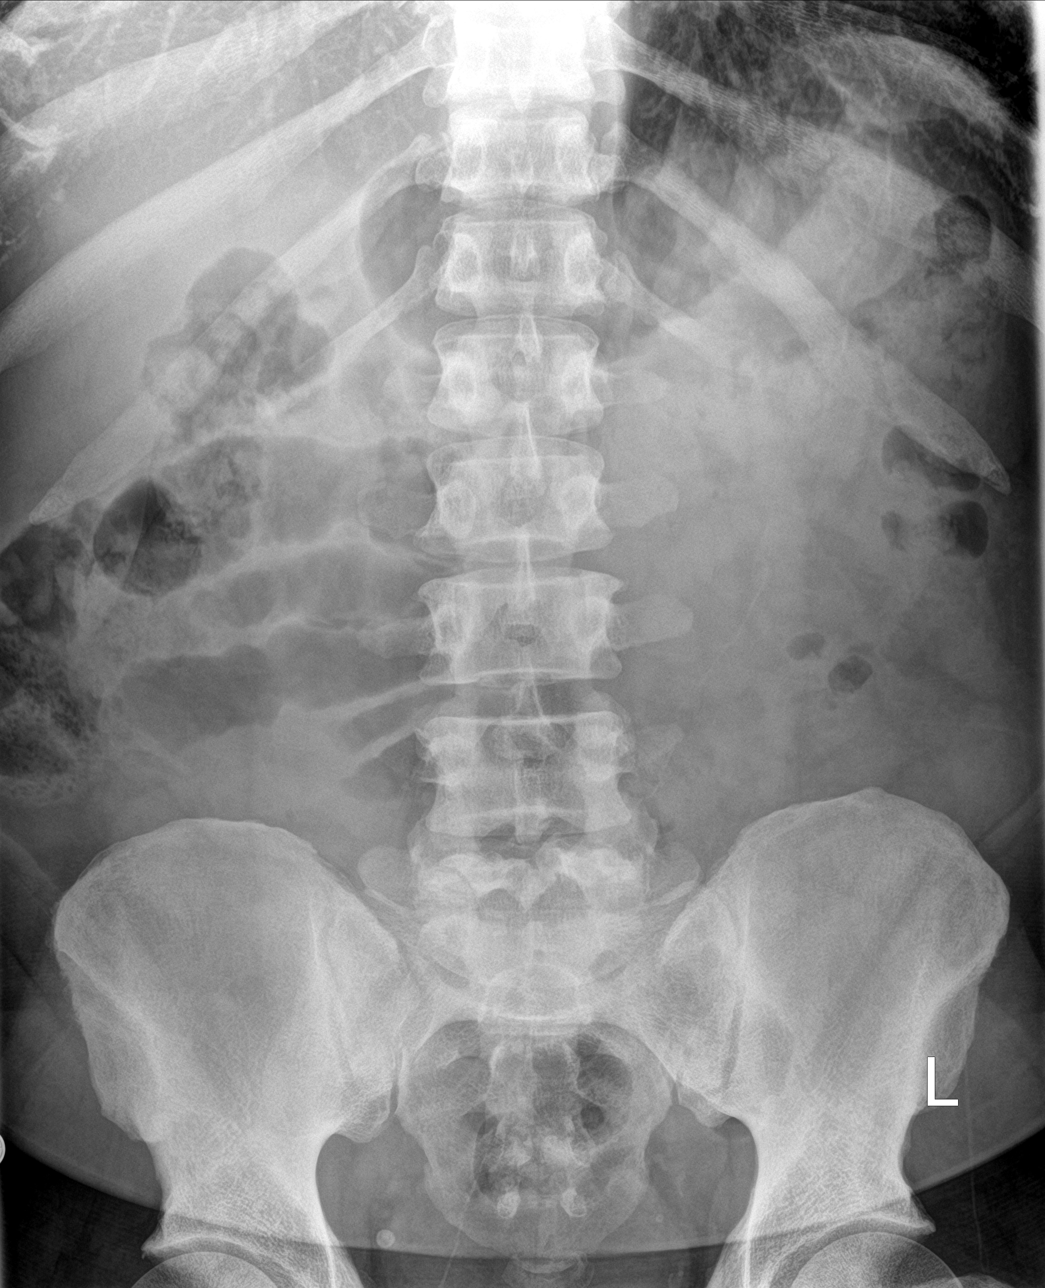

[3 of 3 positions shown; findings below may reference images not displayed]

FINDINGS: There is no evidence of dilated bowel loops or free intraperitoneal
air. No radiopaque calculi or other significant radiographic
abnormality is seen. Heart size and mediastinal contours are within
normal limits. Both lungs are clear.
IMPRESSION: Negative abdominal radiographs.  No acute cardiopulmonary disease.
